# Patient Record
Sex: Female | Born: 1940 | Race: White | Hispanic: No | Marital: Married | Smoking: Former smoker
Health system: Southern US, Community
[De-identification: ages and names within clinical notes are randomized; demographics above are authoritative.]

## PROBLEM LIST (undated history)

## (undated) DIAGNOSIS — E7800 Pure hypercholesterolemia, unspecified: Secondary | ICD-10-CM

## (undated) DIAGNOSIS — M1990 Unspecified osteoarthritis, unspecified site: Secondary | ICD-10-CM

## (undated) DIAGNOSIS — F419 Anxiety disorder, unspecified: Secondary | ICD-10-CM

## (undated) DIAGNOSIS — I10 Essential (primary) hypertension: Secondary | ICD-10-CM

## (undated) HISTORY — PX: ABDOMINAL HYSTERECTOMY: SHX81

## (undated) HISTORY — PX: TOTAL KNEE ARTHROPLASTY: SHX125

---

## 1988-06-15 HISTORY — PX: BREAST CYST ASPIRATION: SHX578

## 2004-11-27 ENCOUNTER — Ambulatory Visit: Payer: Self-pay | Admitting: Internal Medicine

## 2006-12-16 ENCOUNTER — Ambulatory Visit: Payer: Self-pay | Admitting: Internal Medicine

## 2007-06-07 ENCOUNTER — Ambulatory Visit: Payer: Self-pay | Admitting: Internal Medicine

## 2007-12-14 ENCOUNTER — Ambulatory Visit: Payer: Self-pay | Admitting: Internal Medicine

## 2007-12-22 ENCOUNTER — Ambulatory Visit: Payer: Self-pay | Admitting: Internal Medicine

## 2010-01-09 ENCOUNTER — Ambulatory Visit: Payer: Self-pay | Admitting: Internal Medicine

## 2010-01-24 ENCOUNTER — Ambulatory Visit: Payer: Self-pay | Admitting: General Surgery

## 2010-01-27 LAB — PATHOLOGY REPORT

## 2011-01-20 ENCOUNTER — Ambulatory Visit: Payer: Self-pay | Admitting: Internal Medicine

## 2012-02-09 ENCOUNTER — Ambulatory Visit: Payer: Self-pay | Admitting: Internal Medicine

## 2013-02-09 ENCOUNTER — Ambulatory Visit: Payer: Self-pay | Admitting: Internal Medicine

## 2014-03-05 ENCOUNTER — Ambulatory Visit: Payer: Self-pay | Admitting: Internal Medicine

## 2015-01-28 ENCOUNTER — Other Ambulatory Visit: Payer: Self-pay | Admitting: Internal Medicine

## 2015-01-28 DIAGNOSIS — Z1231 Encounter for screening mammogram for malignant neoplasm of breast: Secondary | ICD-10-CM

## 2015-03-11 ENCOUNTER — Ambulatory Visit
Admission: RE | Admit: 2015-03-11 | Discharge: 2015-03-11 | Disposition: A | Discharge status: Home / Self Care | Payer: Medicare Other | Source: Ambulatory Visit | Attending: Internal Medicine | Admitting: Internal Medicine

## 2015-03-11 ENCOUNTER — Other Ambulatory Visit: Payer: Self-pay | Admitting: Internal Medicine

## 2015-03-11 DIAGNOSIS — Z1231 Encounter for screening mammogram for malignant neoplasm of breast: Secondary | ICD-10-CM | POA: Insufficient documentation

## 2015-06-26 ENCOUNTER — Ambulatory Visit
Admission: RE | Admit: 2015-06-26 | Discharge: 2015-06-26 | Disposition: A | Discharge status: Home / Self Care | Payer: Medicare Other | Source: Ambulatory Visit | Attending: Orthopedic Surgery | Admitting: Orthopedic Surgery

## 2015-06-26 ENCOUNTER — Encounter
Admission: RE | Admit: 2015-06-26 | Discharge: 2015-06-26 | Disposition: A | Discharge status: Home / Self Care | Payer: Medicare Other | Source: Ambulatory Visit | Attending: Orthopedic Surgery | Admitting: Orthopedic Surgery

## 2015-06-26 DIAGNOSIS — M170 Bilateral primary osteoarthritis of knee: Secondary | ICD-10-CM | POA: Insufficient documentation

## 2015-06-26 DIAGNOSIS — Z01818 Encounter for other preprocedural examination: Secondary | ICD-10-CM | POA: Insufficient documentation

## 2015-06-26 DIAGNOSIS — I10 Essential (primary) hypertension: Secondary | ICD-10-CM

## 2015-06-26 HISTORY — DX: Essential (primary) hypertension: I10

## 2015-06-26 HISTORY — DX: Pure hypercholesterolemia, unspecified: E78.00

## 2015-06-26 HISTORY — DX: Unspecified osteoarthritis, unspecified site: M19.90

## 2015-06-26 LAB — BASIC METABOLIC PANEL
Anion gap: 7 (ref 5–15)
BUN: 20 mg/dL (ref 6–20)
CHLORIDE: 104 mmol/L (ref 101–111)
CO2: 25 mmol/L (ref 22–32)
Calcium: 9.8 mg/dL (ref 8.9–10.3)
Creatinine, Ser: 0.75 mg/dL (ref 0.44–1.00)
GFR calc non Af Amer: >60 mL/min (ref >60)
Glucose, Bld: 100 mg/dL — ABNORMAL HIGH (ref 65–99)
POTASSIUM: 3.7 mmol/L (ref 3.5–5.1)
SODIUM: 136 mmol/L (ref 135–145)

## 2015-06-26 LAB — ABO/RH: ABO/RH(D): O POS

## 2015-06-26 LAB — URINALYSIS COMPLETE WITH MICROSCOPIC (ARMC ONLY)
BILIRUBIN URINE: NEGATIVE
Bacteria, UA: NONE SEEN
GLUCOSE, UA: NEGATIVE mg/dL
HGB URINE DIPSTICK: NEGATIVE
KETONES UR: NEGATIVE mg/dL
LEUKOCYTES UA: NEGATIVE
NITRITE: NEGATIVE
Protein, ur: NEGATIVE mg/dL
SPECIFIC GRAVITY, URINE: 1.006 (ref 1.005–1.030)
Squamous Epithelial / LPF: NONE SEEN
pH: 7 (ref 5.0–8.0)

## 2015-06-26 LAB — PROTIME-INR
INR: 1.05
PROTHROMBIN TIME: 13.9 s (ref 11.4–15.0)

## 2015-06-26 LAB — CBC
HEMATOCRIT: 43.7 % (ref 35.0–47.0)
HEMOGLOBIN: 14.7 g/dL (ref 12.0–16.0)
MCH: 31.5 pg (ref 26.0–34.0)
MCHC: 33.7 g/dL (ref 32.0–36.0)
MCV: 93.5 fL (ref 80.0–100.0)
Platelets: 334 10*3/uL (ref 150–440)
RBC: 4.67 MIL/uL (ref 3.80–5.20)
RDW: 13.2 % (ref 11.5–14.5)
WBC: 10 10*3/uL (ref 3.6–11.0)

## 2015-06-26 LAB — SURGICAL PCR SCREEN
MRSA, PCR: NEGATIVE
STAPHYLOCOCCUS AUREUS: NEGATIVE

## 2015-06-26 LAB — APTT: aPTT: 27 seconds (ref 24–36)

## 2015-06-26 NOTE — Patient Instructions (Signed)
Your procedure is scheduled on: Wednesday Jan. 25, 2017. Report to Same Day Surgery. To find out your arrival time please call [phone] between 1PM - 3PM on Tuesday Jan, 24, 2017.  Remember: Instructions that are not followed completely may result in serious medical risk, up to and including death, or upon the discretion of your surgeon and anesthesiologist your surgery may need to be rescheduled.    _x___ 1. Do not eat food or drink liquids after midnight. No gum chewing or hard candies.     ____ 2. No Alcohol for 24 hours before or after surgery.   ____ 3. Bring all medications with you on the day of surgery if instructed.    __x__ 4. Notify your doctor if there is any change in your medical condition     (cold, fever, infections).     Do not wear jewelry, make-up, hairpins, clips or nail polish.  Do not wear lotions, powders, or perfumes. You may wear deodorant.  Do not shave 48 hours prior to surgery. Men may shave face and neck.  Do not bring valuables to the hospital.    [facility] is not responsible for any belongings or valuables.               Contacts, dentures or bridgework may not be worn into surgery.  Leave your suitcase in the car. After surgery it may be brought to your room.  For patients admitted to the hospital, discharge time is determined by your treatment team.   Patients discharged the day of surgery will not be allowed to drive home.    Please read over the following fact sheets that you were given:   [facility]  ____ Take these medicines the morning of surgery with A SIP OF WATER: None    ____ Fleet Enema (as directed)   __x__ Use CHG Soap as directed  ____ Use inhalers on the day of surgery  ____ Stop metformin 2 days prior to surgery    ____ Take 1/2 of usual insulin dose the night before surgery and none on the morning of surgery.   __x__ Already stopped aspirin.  __x__ Stop Anti-inflammatories does not apply.  OK  to take Tylenol.   ____ Stop supplements until after surgery.    ____ Bring C-Pap to the hospital.

## 2015-07-10 ENCOUNTER — Inpatient Hospital Stay: Payer: Medicare Other

## 2015-07-10 ENCOUNTER — Inpatient Hospital Stay: Payer: Medicare Other | Admitting: Anesthesiology

## 2015-07-10 ENCOUNTER — Encounter
Admission: RE | Disposition: A | Discharge status: Home Health | Payer: Self-pay | Source: Ambulatory Visit | Attending: Orthopedic Surgery

## 2015-07-10 ENCOUNTER — Encounter: Payer: Self-pay | Admitting: *Deleted

## 2015-07-10 ENCOUNTER — Inpatient Hospital Stay
Admission: RE | Admit: 2015-07-10 | Discharge: 2015-07-12 | DRG: 470 | Disposition: A | Discharge status: Home Health | Payer: Medicare Other | Source: Ambulatory Visit | Attending: Orthopedic Surgery | Admitting: Orthopedic Surgery

## 2015-07-10 DIAGNOSIS — M1711 Unilateral primary osteoarthritis, right knee: Secondary | ICD-10-CM | POA: Diagnosis present

## 2015-07-10 DIAGNOSIS — Z7982 Long term (current) use of aspirin: Secondary | ICD-10-CM

## 2015-07-10 DIAGNOSIS — Z79899 Other long term (current) drug therapy: Secondary | ICD-10-CM | POA: Diagnosis not present

## 2015-07-10 DIAGNOSIS — Z96651 Presence of right artificial knee joint: Secondary | ICD-10-CM

## 2015-07-10 HISTORY — PX: TOTAL KNEE ARTHROPLASTY: SHX125

## 2015-07-10 LAB — PREPARE RBC (CROSSMATCH)

## 2015-07-10 SURGERY — ARTHROPLASTY, KNEE, TOTAL
Anesthesia: Spinal | Laterality: Right

## 2015-07-10 MED ORDER — MIDAZOLAM HCL 5 MG/5ML IJ SOLN
INTRAMUSCULAR | Status: DC | PRN
  Administered 2015-07-10: 2 mg via INTRAVENOUS

## 2015-07-10 MED ORDER — DOCUSATE SODIUM 100 MG PO CAPS
100.0000 mg | ORAL_CAPSULE | Freq: Two times a day (BID) | ORAL | Status: DC
  Administered 2015-07-10 – 2015-07-12 (×4): 100 mg via ORAL
  Filled 2015-07-10 (×4): qty 1

## 2015-07-10 MED ORDER — SODIUM CHLORIDE 0.9 % IV SOLN
INTRAVENOUS | Status: DC
  Administered 2015-07-10: 18:00:00 via INTRAVENOUS

## 2015-07-10 MED ORDER — ADULT MULTIVITAMIN W/MINERALS CH
1.0000 | ORAL_TABLET | ORAL | Status: DC
Start: 2015-07-11 — End: 2015-07-12
  Administered 2015-07-11 – 2015-07-12 (×2): 1 via ORAL
  Filled 2015-07-10 (×3): qty 1

## 2015-07-10 MED ORDER — MENTHOL 3 MG MT LOZG: 1.0000 | LOZENGE | OROMUCOSAL | Status: DC | PRN

## 2015-07-10 MED ORDER — MAGNESIUM CITRATE PO SOLN: 1.0000 | Freq: Once | ORAL | Status: DC | PRN

## 2015-07-10 MED ORDER — FENTANYL CITRATE (PF) 100 MCG/2ML IJ SOLN
INTRAMUSCULAR | Status: AC
Start: 2015-07-10 — End: 2015-07-11
  Filled 2015-07-10: qty 2

## 2015-07-10 MED ORDER — CEFAZOLIN SODIUM-DEXTROSE 2-3 GM-% IV SOLR
2.0000 g | Freq: Four times a day (QID) | INTRAVENOUS | Status: AC
  Administered 2015-07-10 – 2015-07-11 (×2): 2 g via INTRAVENOUS
  Filled 2015-07-10 (×2): qty 50

## 2015-07-10 MED ORDER — ONDANSETRON HCL 4 MG/2ML IJ SOLN
4.0000 mg | Freq: Once | INTRAMUSCULAR | Status: DC | PRN
Start: 2015-07-10 — End: 2015-07-10

## 2015-07-10 MED ORDER — ONDANSETRON HCL 4 MG PO TABS: 4.0000 mg | ORAL_TABLET | Freq: Four times a day (QID) | ORAL | Status: DC | PRN

## 2015-07-10 MED ORDER — SODIUM CHLORIDE 0.9 % IJ SOLN
INTRAMUSCULAR | Status: AC
  Filled 2015-07-10: qty 3

## 2015-07-10 MED ORDER — HYDROMORPHONE HCL 1 MG/ML IJ SOLN: 1.0000 mg | INTRAMUSCULAR | Status: DC | PRN

## 2015-07-10 MED ORDER — ONDANSETRON HCL 4 MG/2ML IJ SOLN: 4.0000 mg | Freq: Four times a day (QID) | INTRAMUSCULAR | Status: DC | PRN

## 2015-07-10 MED ORDER — ENOXAPARIN SODIUM 30 MG/0.3ML ~~LOC~~ SOLN
30.0000 mg | Freq: Two times a day (BID) | SUBCUTANEOUS | Status: DC
  Administered 2015-07-11 – 2015-07-12 (×3): 30 mg via SUBCUTANEOUS
  Filled 2015-07-10 (×3): qty 0.3

## 2015-07-10 MED ORDER — ALUM & MAG HYDROXIDE-SIMETH 200-200-20 MG/5ML PO SUSP: 30.0000 mL | ORAL | Status: DC | PRN

## 2015-07-10 MED ORDER — HYDROCHLOROTHIAZIDE 12.5 MG PO CAPS
12.5000 mg | ORAL_CAPSULE | Freq: Every day | ORAL | Status: DC
  Administered 2015-07-11 – 2015-07-12 (×2): 12.5 mg via ORAL
  Filled 2015-07-10 (×2): qty 1

## 2015-07-10 MED ORDER — POTASSIUM CHLORIDE ER 8 MEQ PO CPCR
8.0000 meq | ORAL_CAPSULE | ORAL | Status: DC
  Administered 2015-07-11 – 2015-07-12 (×2): 8 meq via ORAL
  Filled 2015-07-10 (×6): qty 1

## 2015-07-10 MED ORDER — BUPIVACAINE LIPOSOME 1.3 % IJ SUSP
INTRAMUSCULAR | Status: DC | PRN
  Administered 2015-07-10: 120 mL

## 2015-07-10 MED ORDER — FAMOTIDINE 20 MG PO TABS
20.0000 mg | ORAL_TABLET | Freq: Once | ORAL | Status: AC
Start: 2015-07-10 — End: 2015-07-10
  Administered 2015-07-10: 20 mg via ORAL

## 2015-07-10 MED ORDER — CELECOXIB 200 MG PO CAPS
200.0000 mg | ORAL_CAPSULE | Freq: Two times a day (BID) | ORAL | Status: DC
  Administered 2015-07-10 – 2015-07-12 (×4): 200 mg via ORAL
  Filled 2015-07-10 (×4): qty 1

## 2015-07-10 MED ORDER — PHENOL 1.4 % MT LIQD: 1.0000 | OROMUCOSAL | Status: DC | PRN

## 2015-07-10 MED ORDER — ACETAMINOPHEN 325 MG PO TABS: 650.0000 mg | ORAL_TABLET | Freq: Four times a day (QID) | ORAL | Status: DC | PRN

## 2015-07-10 MED ORDER — OXYCODONE HCL 5 MG PO TABS
5.0000 mg | ORAL_TABLET | ORAL | Status: DC | PRN
  Administered 2015-07-10 – 2015-07-11 (×5): 5 mg via ORAL
  Administered 2015-07-11 (×2): 10 mg via ORAL
  Administered 2015-07-11: 5 mg via ORAL
  Administered 2015-07-12 (×3): 10 mg via ORAL
  Filled 2015-07-10 (×2): qty 2
  Filled 2015-07-10 (×7): qty 1
  Filled 2015-07-10 (×3): qty 2
  Filled 2015-07-10: qty 1

## 2015-07-10 MED ORDER — MAGNESIUM OXIDE 400 (241.3 MG) MG PO TABS
400.0000 mg | ORAL_TABLET | Freq: Every day | ORAL | Status: DC
  Administered 2015-07-10 – 2015-07-11 (×2): 400 mg via ORAL
  Filled 2015-07-10 (×2): qty 1

## 2015-07-10 MED ORDER — LABETALOL HCL 5 MG/ML IV SOLN
INTRAVENOUS | Status: DC | PRN
  Administered 2015-07-10 (×2): 5 mg via INTRAVENOUS
  Administered 2015-07-10: 10 mg via INTRAVENOUS

## 2015-07-10 MED ORDER — PROPOFOL 500 MG/50ML IV EMUL
INTRAVENOUS | Status: DC | PRN
  Administered 2015-07-10: 75 ug/kg/min via INTRAVENOUS

## 2015-07-10 MED ORDER — NEOMYCIN-POLYMYXIN B GU 40-200000 IR SOLN
Status: DC | PRN
  Administered 2015-07-10: 4 mL

## 2015-07-10 MED ORDER — METHOCARBAMOL 500 MG PO TABS
500.0000 mg | ORAL_TABLET | Freq: Four times a day (QID) | ORAL | Status: DC | PRN
  Administered 2015-07-11 (×3): 500 mg via ORAL
  Filled 2015-07-10 (×3): qty 1

## 2015-07-10 MED ORDER — LISINOPRIL 10 MG PO TABS
10.0000 mg | ORAL_TABLET | Freq: Every day | ORAL | Status: DC
  Administered 2015-07-11 – 2015-07-12 (×2): 10 mg via ORAL
  Filled 2015-07-10 (×2): qty 1

## 2015-07-10 MED ORDER — FENTANYL CITRATE (PF) 100 MCG/2ML IJ SOLN
INTRAMUSCULAR | Status: DC | PRN
  Administered 2015-07-10 (×2): 50 ug via INTRAVENOUS

## 2015-07-10 MED ORDER — BISACODYL 5 MG PO TBEC: 5.0000 mg | DELAYED_RELEASE_TABLET | Freq: Every day | ORAL | Status: DC | PRN

## 2015-07-10 MED ORDER — ONDANSETRON HCL 4 MG/2ML IJ SOLN
INTRAMUSCULAR | Status: DC | PRN
  Administered 2015-07-10: 4 mg via INTRAVENOUS

## 2015-07-10 MED ORDER — CEFAZOLIN SODIUM-DEXTROSE 2-3 GM-% IV SOLR
2.0000 g | Freq: Once | INTRAVENOUS | Status: AC
  Administered 2015-07-10: 2 g via INTRAVENOUS

## 2015-07-10 MED ORDER — MAGNESIUM HYDROXIDE 400 MG/5ML PO SUSP
30.0000 mL | Freq: Every day | ORAL | Status: DC | PRN
  Administered 2015-07-11 – 2015-07-12 (×2): 30 mL via ORAL
  Filled 2015-07-10 (×2): qty 30

## 2015-07-10 MED ORDER — GLYCOPYRROLATE 0.2 MG/ML IJ SOLN
INTRAMUSCULAR | Status: DC | PRN
  Administered 2015-07-10: 0.2 mg via INTRAVENOUS

## 2015-07-10 MED ORDER — ACETAMINOPHEN 650 MG RE SUPP
650.0000 mg | Freq: Four times a day (QID) | RECTAL | Status: DC | PRN
Start: 2015-07-10 — End: 2015-07-12

## 2015-07-10 MED ORDER — ATORVASTATIN CALCIUM 10 MG PO TABS
10.0000 mg | ORAL_TABLET | ORAL | Status: DC
  Administered 2015-07-11 – 2015-07-12 (×2): 10 mg via ORAL
  Filled 2015-07-10 (×3): qty 1

## 2015-07-10 MED ORDER — METOCLOPRAMIDE HCL 5 MG PO TABS
5.0000 mg | ORAL_TABLET | Freq: Three times a day (TID) | ORAL | Status: DC | PRN
Start: 2015-07-10 — End: 2015-07-12

## 2015-07-10 MED ORDER — METOCLOPRAMIDE HCL 5 MG/ML IJ SOLN: 5.0000 mg | Freq: Three times a day (TID) | INTRAMUSCULAR | Status: DC | PRN

## 2015-07-10 MED ORDER — PROPOFOL 10 MG/ML IV BOLUS
INTRAVENOUS | Status: DC | PRN
  Administered 2015-07-10: 50 mg via INTRAVENOUS

## 2015-07-10 MED ORDER — LACTATED RINGERS IV SOLN
INTRAVENOUS | Status: DC
  Administered 2015-07-10: 12:00:00 via INTRAVENOUS

## 2015-07-10 MED ORDER — FENTANYL CITRATE (PF) 100 MCG/2ML IJ SOLN
25.0000 ug | INTRAMUSCULAR | Status: DC | PRN
  Administered 2015-07-10 (×4): 25 ug via INTRAVENOUS

## 2015-07-10 MED ORDER — ASPIRIN EC 81 MG PO TBEC
81.0000 mg | DELAYED_RELEASE_TABLET | ORAL | Status: DC
  Administered 2015-07-11 – 2015-07-12 (×2): 81 mg via ORAL
  Filled 2015-07-10 (×3): qty 1

## 2015-07-10 MED ORDER — LISINOPRIL-HYDROCHLOROTHIAZIDE 10-12.5 MG PO TABS: 1.0000 | ORAL_TABLET | Freq: Every day | ORAL | Status: DC

## 2015-07-10 MED ORDER — METHOCARBAMOL 1000 MG/10ML IJ SOLN: 500.0000 mg | Freq: Four times a day (QID) | INTRAVENOUS | Status: DC | PRN

## 2015-07-10 SURGICAL SUPPLY — 62 items
AUTOTRANSFUS HAS 1/8 (MISCELLANEOUS) ×3
BAG DECANTER FOR FLEXI CONT (MISCELLANEOUS) ×3 IMPLANT
BLADE DEBAKEY 8.0 (BLADE) ×2 IMPLANT
BLADE DEBAKEY 8.0MM (BLADE) ×1
BLADE SAW 1 (BLADE) ×3 IMPLANT
BLADE SAW 1/2 (BLADE) ×3 IMPLANT
BLADE SURG 15 STRL LF DISP TIS (BLADE) ×1 IMPLANT
BLADE SURG 15 STRL SS (BLADE) ×2
CANISTER SUCT 1200ML W/VALVE (MISCELLANEOUS) ×6 IMPLANT
CAP KNEE TOTAL 3 SIGMA ×3 IMPLANT
CATH TRAY METER 16FR LF (MISCELLANEOUS) ×3 IMPLANT
CEMENT HV SMART SET (Cement) ×6 IMPLANT
CLOSURE WOUND 1/2 X4 (GAUZE/BANDAGES/DRESSINGS) ×2
COOLER POLAR GLACIER W/PUMP (MISCELLANEOUS) ×3 IMPLANT
DRAPE IMP U-DRAPE 54X76 (DRAPES) ×3 IMPLANT
DRAPE INCISE IOBAN 66X60 STRL (DRAPES) ×3 IMPLANT
DRAPE SHEET LG 3/4 BI-LAMINATE (DRAPES) ×3 IMPLANT
DRAPE SURG 17X11 SM STRL (DRAPES) ×6 IMPLANT
DRSG OPSITE POSTOP 4X12 (GAUZE/BANDAGES/DRESSINGS) ×3 IMPLANT
DRSG OPSITE POSTOP 4X14 (GAUZE/BANDAGES/DRESSINGS) ×3 IMPLANT
DURAPREP 26ML APPLICATOR (WOUND CARE) ×9 IMPLANT
ELECT REM PT RETURN 9FT ADLT (ELECTROSURGICAL) ×3
ELECTRODE REM PT RTRN 9FT ADLT (ELECTROSURGICAL) ×1 IMPLANT
GAUZE PETRO XEROFOAM 1X8 (MISCELLANEOUS) ×6 IMPLANT
GAUZE SPONGE 4X4 12PLY STRL (GAUZE/BANDAGES/DRESSINGS) ×3 IMPLANT
GLOVE BIOGEL PI IND STRL 9 (GLOVE) ×1 IMPLANT
GLOVE BIOGEL PI INDICATOR 9 (GLOVE) ×2
GLOVE SURG 9.0 ORTHO LTXF (GLOVE) ×9 IMPLANT
GOWN STRL REUS TWL 2XL XL LVL4 (GOWN DISPOSABLE) ×3 IMPLANT
GOWN STRL REUS W/ TWL LRG LVL3 (GOWN DISPOSABLE) ×4 IMPLANT
GOWN STRL REUS W/TWL LRG LVL3 (GOWN DISPOSABLE) ×8
GOWN STRL REUS W/TWL XL LVL4 (GOWN DISPOSABLE) ×3 IMPLANT
HANDPIECE SUCTION TUBG SURGILV (MISCELLANEOUS) ×3 IMPLANT
IMMBOLIZER KNEE 19 BLUE UNIV (SOFTGOODS) ×3 IMPLANT
IV NS 100ML SINGLE PACK (IV SOLUTION) ×3 IMPLANT
KIT RM TURNOVER STRD PROC AR (KITS) ×3 IMPLANT
NDL SAFETY 18GX1.5 (NEEDLE) ×3 IMPLANT
NDL SAFETY 22GX1.5 (NEEDLE) ×3 IMPLANT
NEEDLE SPNL 20GX3.5 QUINCKE YW (NEEDLE) ×3 IMPLANT
NS IRRIG 1000ML POUR BTL (IV SOLUTION) ×3 IMPLANT
PACK TOTAL KNEE (MISCELLANEOUS) ×3 IMPLANT
PAD PREP 24X41 OB/GYN DISP (PERSONAL CARE ITEMS) ×3 IMPLANT
PAD WRAPON POLAR KNEE (MISCELLANEOUS) ×1 IMPLANT
SOL .9 NS 3000ML IRR  AL (IV SOLUTION) ×2
SOL .9 NS 3000ML IRR UROMATIC (IV SOLUTION) ×1 IMPLANT
SPONGE DRAIN TRACH 4X4 STRL 2S (GAUZE/BANDAGES/DRESSINGS) ×3 IMPLANT
SPONGE LAP 18X18 5 PK (GAUZE/BANDAGES/DRESSINGS) ×3 IMPLANT
STAPLER SKIN PROX 35W (STAPLE) ×3 IMPLANT
STRIP CLOSURE SKIN 1/2X4 (GAUZE/BANDAGES/DRESSINGS) ×4 IMPLANT
SUCTION FRAZIER HANDLE 10FR (MISCELLANEOUS) ×2
SUCTION TUBE FRAZIER 10FR DISP (MISCELLANEOUS) ×1 IMPLANT
SUT ETHIBOND NAB CT1 #1 30IN (SUTURE) ×9 IMPLANT
SUT MNCRL AB 3-0 PS2 27 (SUTURE) ×6 IMPLANT
SUT VIC AB 0 CT1 36 (SUTURE) ×3 IMPLANT
SUT VIC AB 2-0 CT1 (SUTURE) ×9 IMPLANT
SYR 20CC LL (SYRINGE) ×3 IMPLANT
SYR 30ML LL (SYRINGE) ×3 IMPLANT
SYR 50ML LL SCALE MARK (SYRINGE) ×3 IMPLANT
SYSTEM AUTOTRANSFUS DUAL TROCR (MISCELLANEOUS) ×1 IMPLANT
TOWER CARTRIDGE SMART MIX (DISPOSABLE) ×3 IMPLANT
TUBE SUCT KAM VAC (TUBING) ×3 IMPLANT
WRAPON POLAR PAD KNEE (MISCELLANEOUS) ×3

## 2015-07-10 NOTE — H&P (Signed)
The patient has been re-examined, and the chart reviewed, and there have been no interval changes to the documented history and physical.    The risks, benefits, and alternatives have been discussed at length, and the patient is willing to proceed.   

## 2015-07-10 NOTE — Op Note (Signed)
DATE OF SURGERY:  07/10/2015 TIME: 4:48 PM  PATIENT NAME:  Maria Faulkner   AGE: 75 y.o.    PRE-OPERATIVE DIAGNOSIS:  RIGHT KNEE UNILATERAL PRIMARY OSTEOARTHRITIS  POST-OPERATIVE DIAGNOSIS:  Same  PROCEDURE:  Procedure(s): RIGHT TOTAL KNEE ARTHROPLASTY  SURGEON:  Juanell Fairly, MD   ASSISTANT:  Myra Rude, MD, Jamison Neighbor, [place]  OPERATIVE IMPLANTS: Depuy PFC Sigma, Posterior Stabilized Femural component size 2, Tibia size rotating platform component size 2.5, Patella polyethylene 3-peg oval button size 35, with a 12.5 mm polyethylene insert.   PREOPERATIVE INDICATIONS:  Maria Faulkner is an 75 y.o. female who has a diagnosis of right knee osteoarthritis and elected for a right total knee arthroplasty after failing nonoperative treatment who has significant impairment of their activities of daily living.  Radiographs have demonstrated tricompartmental osteoarthritis joint space narrowing, osteophytes, subchondral sclerosis and cyst formation.  The risks, benefits, and alternatives were discussed at length including but not limited to the risks of infection, bleeding, nerve or blood vessel injury, knee stiffness, fracture, dislocation, loosening or failure of the hardware and the need for further surgery. Medical risks include but not limited to DVT and pulmonary embolism, myocardial infarction, stroke, pneumonia, respiratory failure and death. I discussed these risks with the patient in my office prior to the date of surgery. They understood these risks and were willing to proceed.  OPERATIVE FINDINGS AND UNIQUE ASPECTS OF THE CASE:  Tricompartmental osteoarthritis  OPERATIVE DESCRIPTION:  The patient was brought to the operative room and placed in a supine position after undergoing placement of a spinal anesthetic.  A Foley catheter was placed.  IV antibiotics were given. Patient received Ancef 2 grams IV. The lower extremity was prepped and draped in the usual  sterile fashion.  A time out was performed to verify the patient's name, date of birth, medical record number, correct site of surgery and correct procedure to be performed. The timeout was also used to confirm the patient received antibiotics and that appropriate instruments, implants and radiographs studies were available in the room.  The leg was elevated and exsanguinated with an Esmarch and the tourniquet was inflated to 275 mmHg for 123 minutes..  A midline incision was made over the right knee. Full-thickness skin flaps were developed. A medial parapatellar arthrotomy was then made and the patella everted and the knee was brought into 90 of flexion. Hoffa's fat pad along with the cruciate ligaments and medial and lateral menisci were resected.   The distal femoral intramedullary canal was opened with a drill and the intramedullary distal femoral cutting jig was inserted into the femoral canal pinned into position. It was set at 5 degrees resecting 12 mm off the distal femur given her baseline flexion contracture.  Care was taken to protect the collateral ligaments during distal femoral resection.  The distal femoral resection was performed with an oscillating saw. The femoral cutting guide was then removed.  The extramedullary tibial cutting guide was then placed using the anterior tibial crest and second ray of the foot as a references.  The tibial cutting guide was adjusted to allow for appropriate posterior slope.  The tibial cutting block was pinned into position. The slotted stylus was used to measure the proximal tibial resection of 10 mm off the high lateral side.  The tibial long rod alignment guide was then used to confirm position of the cutting block. A third cross pin through the tibial cutting block was then drilled into position to allow for rotational  stability. Care was taken during the tibial resection to protect the medial and collateral ligaments.  The resected tibial bone was  removed along with the posterior horns of the menisci.  The PCL was sacrificed.  Extension gap was measured with a spacer block and alignment and extension was confirmed using a long alignment rod.  The attention was then turned back to the femur. The posterior referencing distal femoral sizing guide was applied to the distal femur.  The femur was sized to be a 2. Rotation of the referencing guide was checked with the epicondylar axis and Whitesides line. Then the 4-in-1 cutting jig was then applied to the distal femur. A stylus was used to confirm that the anterior femur would not be notched.   Then the anterior, posterior and chamfer femoral cuts were then made with an oscillating saw.  All posterior osteophytes were removed.  The flexion gap was then measured with a flexion spacer block and long alignment rod and was found to be symmetric with the extension gap and perpendicular to mechanical axis of the tibia.  The distal femoral preparation was completed by performing the posterior stabilized box cut using the cutting block. The entry site for the intramedullary femoral guide was filled with autologous bone graft from bone previously resected earlier in the case.  The proximal tibia plateau was then sized with trial trays. The best coverage was achieved with a size 2.5. This tibial tray was then pinned into position. The proximal tibia was then prepared with the reamer and keel punch.  After tibial preparation was completed, all trial components were inserted with polyethylene trials.  The knee was found to have excellent balance and full motion with a size 12.5 mm tibial polyethylene insert..    The attention was then turned to preparation of the patella. The thickness of the patella was measured with a caliper, the diameter measured with the patella templates to a size 35.  The patella resection was then made with an oscillating saw using the patella cutting guide.  The final patellar thickness 15 mm.   3 peg holes for the patella component were then drilled. The trial patella was then placed. Knee was taken through a full range of motion and deemed to be stable with the trial components. All trial components were then removed. The knee capsule was then injected with Exparel. The joint was copiously irrigated with pulse lavage.  The final total knee arthroplasty components were then cemented into place with a 12.5 mm trial polyethylene insert and all excess methylmethacrylate was removed.  The joint was again copiously irrigated. After the cement had hardened the knee was again taken through a full range of motion. It was felt to be most stable with the 12.5 mm tibial polyethylene insert. The actual tibial polyethylene insert was then placed.   The knee was taken through a range of motion and the patella tracked well and the knee was again irrigated copiously.    An Autovac drain was placed. The 2 drain limbs were brought out through the superior lateral knee. The medial arthrotomy was closed with #1 Ethibond. The subcutaneous tissue closed with 0 and 2-0 vicryl, and skin approximated with staples.  A dry sterile and compressive dressing was applied.  A Polar Care was applied to the operative knee along with TENS unit pads and a knee immobilizer.  The patient was awakened and brought to the PACU in stable and satisfactory condition.  All sharp, lap and instrument counts were  correct at the conclusion the case. I spoke with the patient's husband in the postop consultation room to let them know the case it done without complication and the patient was stable in recovery room.   Total tourniquet time was 123 minutes.    Kathreen Devoid, MD

## 2015-07-10 NOTE — Progress Notes (Signed)
TENS REP in to speak with patient and spouse

## 2015-07-10 NOTE — Transfer of Care (Signed)
Immediate Anesthesia Transfer of Care Note  Patient: Maria Faulkner  Procedure(s) Performed: Procedure(s): TOTAL KNEE ARTHROPLASTY (Right)  Patient Location: PACU  Anesthesia Type:Spinal  Level of Consciousness: awake and sedated  Airway & Oxygen Therapy: Patient Spontanous Breathing and Patient connected to face mask oxygen  Post-op Assessment: Report given to RN and Post -op Vital signs reviewed and stable  Post vital signs: Reviewed and stable  Last Vitals:  Filed Vitals:   07/10/15 1159  BP: 145/50  Pulse: 86  Temp: 36.6 C  Resp: 18    Complications: No apparent anesthesia complications

## 2015-07-10 NOTE — Anesthesia Preprocedure Evaluation (Addendum)
Anesthesia Evaluation  Patient identified by MRN, date of birth, ID band Patient awake    Reviewed: Allergy & Precautions, NPO status , Patient's Chart, lab work & pertinent test results  Airway Mallampati: III  TM Distance: >3 FB Neck ROM: Limited    Dental  (+) Partial Lower, Partial Upper   Pulmonary former smoker,    Pulmonary exam normal breath sounds clear to auscultation       Cardiovascular hypertension, Pt. on medications Normal cardiovascular exam     Neuro/Psych negative neurological ROS  negative psych ROS   GI/Hepatic negative GI ROS, Neg liver ROS,   Endo/Other  negative endocrine ROS  Renal/GU negative Renal ROS  negative genitourinary   Musculoskeletal  (+) Arthritis , Osteoarthritis,    Abdominal Normal abdominal exam  (+)   Peds negative pediatric ROS (+)  Hematology negative hematology ROS (+)   Anesthesia Other Findings Increased cholesterol  Reproductive/Obstetrics                           Anesthesia Physical Anesthesia Plan  ASA: II  Anesthesia Plan: Spinal   Post-op Pain Management:    Induction: Intravenous  Airway Management Planned: Nasal Cannula  Additional Equipment:   Intra-op Plan:   Post-operative Plan:   Informed Consent: I have reviewed the patients History and Physical, chart, labs and discussed the procedure including the risks, benefits and alternatives for the proposed anesthesia with the patient or authorized representative who has indicated his/her understanding and acceptance.   Dental advisory given  Plan Discussed with: CRNA and Surgeon  Anesthesia Plan Comments:         Anesthesia Quick Evaluation

## 2015-07-10 NOTE — Progress Notes (Signed)
Cefazolin 2 gm and sacral drsg sent with pt to OR (for block) To OR with thermal cap in place

## 2015-07-10 NOTE — Anesthesia Procedure Notes (Signed)
Spinal Patient location during procedure: OR Start time: 07/10/2015 1:36 PM End time: 07/10/2015 1:41 PM Staffing Anesthesiologist: Yves Dill Performed by: anesthesiologist  Preanesthetic Checklist Completed: patient identified, site marked, surgical consent, pre-op evaluation, timeout performed, IV checked, risks and benefits discussed and monitors and equipment checked Spinal Block Patient position: sitting Prep: Betadine Patient monitoring: heart rate, cardiac monitor, continuous pulse ox and blood pressure Approach: midline Location: L3-4 Injection technique: single-shot Needle Needle type: Whitacre  Needle gauge: 25 G Assessment Sensory level: T8 Additional Notes Time out  Called.  Patient placed in sitting position and prepped and draped in sterile fashion.  A skin wheal was made with 1% Lidocaine plain.  A 25 G Whit needle was advanced with the return of clear CSF in all 4 Quads.  Patient tolerated the procedure well .. !  of marcaine +  of Tetracaine injected.

## 2015-07-11 ENCOUNTER — Encounter: Payer: Self-pay | Admitting: Orthopedic Surgery

## 2015-07-11 LAB — TYPE AND SCREEN
ABO/RH(D): O POS
Antibody Screen: NEGATIVE
UNIT DIVISION: 0
Unit division: 0

## 2015-07-11 LAB — BASIC METABOLIC PANEL
ANION GAP: 4 — AB (ref 5–15)
BUN: 16 mg/dL (ref 6–20)
CHLORIDE: 105 mmol/L (ref 101–111)
CO2: 29 mmol/L (ref 22–32)
Calcium: 9.1 mg/dL (ref 8.9–10.3)
Creatinine, Ser: 0.76 mg/dL (ref 0.44–1.00)
GFR calc non Af Amer: >60 mL/min (ref >60)
Glucose, Bld: 132 mg/dL — ABNORMAL HIGH (ref 65–99)
POTASSIUM: 5.1 mmol/L (ref 3.5–5.1)
SODIUM: 138 mmol/L (ref 135–145)

## 2015-07-11 LAB — CBC
HCT: 35.5 % (ref 35.0–47.0)
HEMOGLOBIN: 12 g/dL (ref 12.0–16.0)
MCH: 32 pg (ref 26.0–34.0)
MCHC: 34 g/dL (ref 32.0–36.0)
MCV: 94.1 fL (ref 80.0–100.0)
Platelets: 227 10*3/uL (ref 150–440)
RBC: 3.77 MIL/uL — AB (ref 3.80–5.20)
RDW: 12.8 % (ref 11.5–14.5)
WBC: 13.3 10*3/uL — AB (ref 3.6–11.0)

## 2015-07-11 NOTE — Progress Notes (Signed)
Physical Therapy Treatment Patient Details Name: Maria Faulkner MRN: [id number] DOB: [birth year] Today's Date: 07/11/2015    History of Present Illness Pt underwent R TKR without reported post-op complications. Pt is POD#1 at time of evaluation. No reported falls in the last 12 months.    PT Comments    Pt demonstrates ability to increase ambulation distance this afternoon. Able to progress to seated exercises and R knee flexion AROM appears to be improving. Sequencing with bed mobility and transfers improving. Pt will benefit from skilled PT services to address deficits in strength, balance, and mobility in order to return to full function at home.   Follow Up Recommendations  Home health PT     Equipment Recommendations  None recommended by PT    Recommendations for Other Services       Precautions / Restrictions Precautions Precautions: Knee;Fall Precaution Booklet Issued: Yes (comment) (per PT) Required Braces or Orthoses: Knee Immobilizer - Right Knee Immobilizer - Right: Discontinue once straight leg raise with < 10 degree lag Restrictions Weight Bearing Restrictions: Yes RLE Weight Bearing: Partial weight bearing RLE Partial Weight Bearing Percentage or Pounds: 25-50%    Mobility  Bed Mobility Overal bed mobility: Needs Assistance Bed Mobility: Sit to Supine       Sit to supine: Min assist   General bed mobility comments: Pt requires assist for RLE when returning to bed.  Transfers Overall transfer level: Needs assistance Equipment used: Rolling walker (2 wheeled) Transfers: Sit to/from Stand Sit to Stand: Min guard         General transfer comment: Pt again provided cues for safe hand placement and proper sequencing. Able to maintain PWB on RLE. Good stability noted during transfer.  Ambulation/Gait Ambulation/Gait assistance: Min guard Ambulation Distance (Feet): 75 Feet Assistive device: Rolling walker (2 wheeled) Gait Pattern/deviations:  Decreased step length - left;Decreased stance time - right;Decreased weight shift to right Gait velocity: Decreased   General Gait Details: Pt demonstrates increased ambulation distance this afternoon. Cues for sequencing with walker during ambulation and turns. Decreased weight accetpance to RLE during gait and decrease push off at terminal stance. Pt reports increase in R knee pain with ambulation. Vitals remain WNL throughout ambulation distance.   Stairs            Wheelchair Mobility    Modified Rankin (Stroke Patients Only)       Balance Overall balance assessment: Needs assistance Sitting-balance support: No upper extremity supported Sitting balance-Leahy Scale: Good       Standing balance-Leahy Scale: Fair                      Cognition Arousal/Alertness: Awake/alert Behavior During Therapy: WFL for tasks assessed/performed Overall Cognitive Status: Within Functional Limits for tasks assessed                      Exercises Total Joint Exercises Ankle Circles/Pumps: Strengthening;Both;10 reps;Seated (heel raises) Hip ABduction/ADduction: Strengthening;10 reps;Seated;Both Long Arc Quad: Strengthening;Right;10 reps;Seated Knee Flexion: Strengthening;Right;10 reps;Seated Marching in Standing: Strengthening;Right;10 reps;Seated    General Comments        Pertinent Vitals/Pain Pain Assessment: 0-10 Pain Score: 2  Pain Location: R knee, increases to 6/10 with mobility Pain Intervention(s): Limited activity within patient's tolerance;Monitored during session;Premedicated before session    Home Living                      Prior Function  PT Goals (current goals can now be found in the care plan section) Acute Rehab PT Goals Patient Stated Goal: to go home PT Goal Formulation: With patient Time For Goal Achievement: 07/25/15 Potential to Achieve Goals: Good Progress towards PT goals: Progressing toward goals     Frequency  BID    PT Plan Current plan remains appropriate    Co-evaluation             End of Session Equipment Utilized During Treatment: Gait belt Activity Tolerance: Patient tolerated treatment well Patient left: with SCD's reapplied;in bed;with bed alarm set;with call bell/phone within reach (polar care in place, towel roll under heel, KI doffed)     Time: 1610-9604 PT Time Calculation (min) (ACUTE ONLY): 24 min  Charges:  $Gait Training: 8-22 mins $Therapeutic Exercise: 8-22 mins                    G Codes:      Sharalyn Ink Huprich PT, DPT   Huprich,Jason 07/11/2015, 4:26 PM

## 2015-07-11 NOTE — Progress Notes (Signed)
Subjective:  POD #1  S/p Right total knee  Patient reports pain as mild.  Patient is up out of bed to a chair. Her pain is well-controlled. She is using incentive spirometry. She has no complaints.  Objective:   VITALS:   Filed Vitals:   07/10/15 2218 07/11/15 0010 07/11/15 0344 07/11/15 0824  BP: 154/56 154/58 164/50 126/54  Pulse:  86 80 81  Temp:  98.8 F (37.1 C) 98.9 F (37.2 C) 98.2 F (36.8 C)  TempSrc:  Oral Oral Oral  Resp:  Height:      Weight:      SpO2:  96% 99% 100%    PHYSICAL EXAM:  Right lower extremity: Patient's dressing is clean dry and intact. I personally removed the hemovac drain.  Patient has palpable pedal pulses, intact sensation light touch and intact motor function in the right lower extremity. She dorsiflex plantarflex her ankle and flex and extend her toes.    LABS  Results for orders placed or performed during the hospital encounter of 07/10/15 (from the past 24 hour(s))  CBC     Status: Abnormal   Collection Time: 07/11/15  8:34 AM  Result Value Ref Range   WBC 13.3 (H) 3.6 - 11.0 K/uL   RBC 3.77 (L) 3.80 - 5.20 MIL/uL   Hemoglobin 12.0 12.0 - 16.0 g/dL   HCT 40.9 81.1 - 91.4 %   MCV 94.1 80.0 - 100.0 fL   MCH 32.0 26.0 - 34.0 pg   MCHC 34.0 32.0 - 36.0 g/dL   RDW 78.2 95.6 - 21.3 %   Platelets 227 150 - 440 K/uL  Basic metabolic panel     Status: Abnormal   Collection Time: 07/11/15  8:34 AM  Result Value Ref Range   Sodium 138 135 - 145 mmol/L   Potassium 5.1 3.5 - 5.1 mmol/L   Chloride 105 101 - 111 mmol/L   CO2 29 22 - 32 mmol/L   Glucose, Bld 132 (H) 65 - 99 mg/dL   BUN 16 6 - 20 mg/dL   Creatinine, Ser 0.86 0.44 - 1.00 mg/dL   Calcium 9.1 8.9 - 57.8 mg/dL   GFR calc non Af Amer >60 >60 mL/min   GFR calc Af Amer >60 >60 mL/min   Anion gap 4 (L) 5 - 15    Dg Knee 1-2 Views Right  07/10/2015  CLINICAL DATA:  Postop right knee arthroplasty. EXAM: RIGHT KNEE - 1-2 VIEW COMPARISON:  None. FINDINGS: Status post right  total knee arthroplasty. The hardware is well positioned. There is no evidence of acute fracture or dislocation. Surgical drains are in place. There is a small amount gas within the joint and anterior soft tissues. Anterior skin staples are noted. IMPRESSION: No demonstrated complication following right total knee arthroplasty. Electronically Signed   By: Carey Bullocks M.D.   On: 07/10/2015 17:49    Assessment/Plan: 1 Day Post-Op   Active Problems:   History of total right knee replacement  I reviewed the patient's labs postoperatively. There is no laboratory abnormality. Her hemoglobin and hematocrit remained stable. Patient is doing well postop. Foley catheter has been removed. She is tolerating physical therapy. She will complete 24 hours of postop antibiotics. Continue current pain management. Continue physical therapy. Patient on Lovenox for DVT prophylaxis along with TED stockings and foot pumps. Patient is encouraged to continue incentive spirometry while awake.    Juanell Fairly , MD 07/11/2015, 1:11 PM

## 2015-07-11 NOTE — Progress Notes (Signed)
CPM applied to rt leg at setting 60 %

## 2015-07-11 NOTE — Anesthesia Postprocedure Evaluation (Signed)
Anesthesia Post Note  Patient: Maria Faulkner  Procedure(s) Performed: Procedure(s) (LRB): TOTAL KNEE ARTHROPLASTY (Right)  Patient location during evaluation: Nursing Unit Anesthesia Type: Spinal Level of consciousness: awake, awake and alert and oriented Pain management: pain level controlled Vital Signs Assessment: post-procedure vital signs reviewed and stable Respiratory status: spontaneous breathing, nonlabored ventilation and respiratory function stable Cardiovascular status: blood pressure returned to baseline and stable Postop Assessment: no headache and no backache Anesthetic complications: no    Last Vitals:  Filed Vitals:   07/11/15 0010 07/11/15 0344  BP: 154/58 164/50  Pulse: 86 80  Temp: 37.1 C 37.2 C  Resp: 18 16    Last Pain:  Filed Vitals:   07/11/15 0409  PainSc: Asleep                 Lyn Records

## 2015-07-11 NOTE — Progress Notes (Signed)
Pt tolerating CPM at 30% , pain controlled with po Meds . Pt ambulating to bathroom using walker and one assist for toileting

## 2015-07-11 NOTE — Progress Notes (Signed)
Clinical Social Worker (CSW) received SNF consult. PT is recommending home health. RN Case Manager is aware of above. Please reconsult if future social work needs arise. CSW signing off.   Dayjah Selman Morgan, LCSW [phone] 

## 2015-07-11 NOTE — Evaluation (Signed)
Occupational Therapy Evaluation Patient Details Name: Maria Faulkner MRN: [id number] DOB: 03/08/1941 Today's Date: 07/11/2015    History of Present Illness Pt underwent R TKR without reported post-op complications. Pt is POD#1 at time of evaluation. No reported falls in the last 12 months.   Clinical Impression   This patient is a 75 year old female who came to [facility] for a R total knee replacement.  Patient lives in a one story home with her husband and had been independent with ADL and functional mobility. She now shows deficits in pain, mobility and activities of daily living  She now requires assistance for lower body dressing and would benefit from Occupational Therapy for ADL/functional mobility training       Follow Up Recommendations   (Home with home health Physical Therapy only, no further Occupational Therapy needed after discharge)    Equipment Recommendations       Recommendations for Other Services       Precautions / Restrictions Precautions Precautions: Knee;Fall Precaution Booklet Issued: Yes (comment) (per PT) Required Braces or Orthoses: Knee Immobilizer - Right Knee Immobilizer - Right: Discontinue once straight leg raise with < 10 degree lag Restrictions Weight Bearing Restrictions: Yes RLE Weight Bearing: Partial weight bearing RLE Partial Weight Bearing Percentage or Pounds: 25-50%      Mobility Bed Mobility  Transfers            Balance                                  ADL                                         General ADL Comments: Had been independent . Today practiced lower body dressing techniques using hip kit as she cannot reach her feet to dress. Patient practiced Donned/doffed socks and pants to knees (drain still in place). Patient requires minimal assist and verbal cues for safety and technique.      Vision     Perception     Praxis      Pertinent Vitals/Pain  Pain Assessment: 0-10 Pain Score: 2  Pain Location: R knee Pain Descriptors / Indicators: Aching Pain Intervention(s): Monitored during session;Premedicated before session;Limited activity within patient's tolerance     Hand Dominance Right   Extremity/Trunk Assessment Upper Extremity Assessment Upper Extremity Assessment: Overall WFL for tasks assessed         Communication Communication Communication: No difficulties   Cognition Arousal/Alertness: Awake/alert Behavior During Therapy: WFL for tasks assessed/performed Overall Cognitive Status: Within Functional Limits for tasks assessed                     General Comments       Exercises Exercises: Total Joint     Shoulder Instructions      Home Living Family/patient expects to be discharged to:: Private residence Living Arrangements: Spouse/significant other Available Help at Discharge: Family Type of Home: House Home Access: Stairs to enter Secretary/administrator of Steps: 3 Entrance Stairs-Rails: Right Home Layout: One level     Bathroom Shower/Tub: Chief Strategy Officer: Handicapped height Bathroom Accessibility: Yes   Home Equipment: Environmental consultant - 2 wheels;Cane - quad;Grab bars - tub/shower          Prior Functioning/Environment Level of  Independence: Independent with assistive device(s)        Comments: occasional use of single point cane.    OT Diagnosis: Acute pain   OT Problem List: Decreased range of motion;Decreased activity tolerance;Impaired balance (sitting and/or standing);Decreased knowledge of use of DME or AE;Pain   OT Treatment/Interventions:      OT Goals(Current goals can be found in the care plan section) Acute Rehab OT Goals Patient Stated Goal: to go home OT Goal Formulation: With patient/family Time For Goal Achievement: 07/25/15 Potential to Achieve Goals: Good  OT Frequency:     Barriers to D/C:            Co-evaluation              End  of Session Equipment Utilized During Treatment:  (hip kit)  Activity Tolerance:   Patient left: in chair;with call bell/phone within reach;with chair alarm set;with family/visitor present   Time: 1025-1045 OT Time Calculation (min): 20 min Charges:  OT General Charges $OT Visit: 1 Procedure OT Evaluation $OT Eval Low Complexity: 1 Procedure OT Treatments $Self Care/Home Management : 8-22 mins G-Codes:    Myrene Galas, MS/OTR/L   07/11/2015, 11:43 AM

## 2015-07-11 NOTE — Care Management Note (Signed)
Case Management Note  Patient Details  Name: TERRIANN DIFONZO MRN: [id number] Date of Birth: 1941/04/03  Subjective/Objective:                  Met with patient and her husband to discuss discharge planning. She plans to go home. She agrees to home health PT but unsure what agency. She uses [facility]  [phone] for Rx. She has a walker available at home for use.   Action/Plan: List of home health agencies left with patient for review. She would like to use [facility]. [place] 5m [phone]. Patient agrees with cost. RNCM will continue to follow.   Expected Discharge Date:                  Expected Discharge Plan:     In-House Referral:     Discharge planning Services  CM Consult  Post Acute Care Choice:  Home Health Choice offered to:  Patient  DME Arranged:    DME Agency:     HH Arranged:  PT HH Agency:     Status of Service:  In process, will continue to follow  Medicare Important Message Given:    Date Medicare IM Given:    Medicare IM give by:    Date Additional Medicare IM Given:    Additional Medicare Important Message give by:     [place] Meet[place], dates discussed:    Additional Comments:  AMarshell Garfinkel RN 07/11/2015, 11:04 AM

## 2015-07-11 NOTE — Evaluation (Signed)
Physical Therapy Evaluation Patient Details Name: Maria Faulkner MRN: [id number] DOB: 1940/10/20 Today's Date: 07/11/2015   History of Present Illness  Pt underwent R TKR without reported post-op complications. Pt is POD#1 at time of evaluation. No reported falls in the last 12 months.  Clinical Impression  Pt requires assist with bed mobility but demonstrates good strength with transfers and ambulation. KI donned for all mobility. Pt able to complete all bed exercises as instructed. She requires assist for SLR and LAQ due to weakness. Pt will benefit from [facility] PT at discharge. No DME needs currently. Pt will benefit from skilled PT services to address deficits in strength, balance, and mobility in order to return to full function at home.     Follow Up Recommendations Home health PT    Equipment Recommendations  None recommended by PT    Recommendations for Other Services       Precautions / Restrictions Precautions Precautions: Knee;Fall Precaution Booklet Issued: Yes (comment) Required Braces or Orthoses: Knee Immobilizer - Right Knee Immobilizer - Right: Discontinue once straight leg raise with < 10 degree lag Restrictions Weight Bearing Restrictions: Yes RLE Weight Bearing: Partial weight bearing RLE Partial Weight Bearing Percentage or Pounds: 25-50%      Mobility  Bed Mobility Overal bed mobility: Needs Assistance Bed Mobility: Supine to Sit     Supine to sit: Min assist     General bed mobility comments: Pt requires assist for torso to go from supine to sitting. Minimal assistance to adduct RLE when exiting bed to the L. Once upright in sitting pt able to independently scoot toward HOB.  Transfers Overall transfer level: Needs assistance Equipment used: Rolling walker (2 wheeled) Transfers: Sit to/from Stand Sit to Stand: Min guard         General transfer comment: Pt provided education regarding PWB status and safe hand placement during transferse. KI  donned for all mobility. Pt able to demonstrate good LLE strength and ability to maintain PWB restrictions. Good stability when upright in standing  Ambulation/Gait Ambulation/Gait assistance: Min guard Ambulation Distance (Feet): 25 Feet Assistive device: Rolling walker (2 wheeled) Gait Pattern/deviations: Step-to pattern;Decreased step length - left;Decreased stance time - right;Decreased weight shift to right Gait velocity: Decreased Gait velocity interpretation: <1.8 ft/sec, indicative of risk for recurrent falls General Gait Details: Pt demonstrates good sequencing with rolling walker following education. Able to maintain PWB on RLE. Gait speed is slow but appropriate for POD#1. Pt reports minimal increase in pain with ambulation. Good stability noted without safety concerns  Stairs            Wheelchair Mobility    Modified Rankin (Stroke Patients Only)       Balance Overall balance assessment: Needs assistance Sitting-balance support: No upper extremity supported Sitting balance-Leahy Scale: Good     Standing balance support: Bilateral upper extremity supported Standing balance-Leahy Scale: Fair                               Pertinent Vitals/Pain Pain Assessment: 0-10 Pain Score: 2  Pain Location: R knee Pain Descriptors / Indicators: Aching Pain Intervention(s): Monitored during session;Premedicated before session;Limited activity within patient's tolerance    Home Living Family/patient expects to be discharged to:: Private residence Living Arrangements: Spouse/significant other Available Help at Discharge: Family Type of Home: House Home Access: Stairs to enter Entrance Stairs-Rails: Right Entrance Stairs-Number of Steps: 3 (Could also enter front, 1+1 step with  bilateral rails) Home Layout: One level Home Equipment: Walker - 2 wheels;Cane - quad;Grab bars - tub/shower (no shower chair, no BSC)      Prior Function Level of Independence:  Independent with assistive device(s)         Comments: Occasional use of single point cane. Limited community ambulation.     Hand Dominance   Dominant Hand: Right    Extremity/Trunk Assessment   Upper Extremity Assessment: Overall WFL for tasks assessed           Lower Extremity Assessment: RLE deficits/detail RLE Deficits / Details: LLE grossly WFL. RLE: 2 finger assist for SLR, assist for LAQ. Full DF/PF. Pt reports full sensation to light touch       Communication   Communication: No difficulties  Cognition Arousal/Alertness: Awake/alert Behavior During Therapy: WFL for tasks assessed/performed Overall Cognitive Status: Within Functional Limits for tasks assessed                      General Comments      Exercises Total Joint Exercises Ankle Circles/Pumps: Strengthening;Both;10 reps;Supine Quad Sets: Strengthening;Both;10 reps;Supine Gluteal Sets: Strengthening;Both;10 reps;Supine Towel Squeeze: Strengthening;Both;10 reps;Supine Hip ABduction/ADduction: Strengthening;Right;10 reps;Supine Straight Leg Raises: Strengthening;Right;10 reps;Supine Long Arc Quad: Strengthening;Right;10 reps;Seated Goniometric ROM: -7 to 62 degrees AAROM, pain limited      Assessment/Plan    PT Assessment Patient needs continued PT services  PT Diagnosis Difficulty walking;Abnormality of gait;Generalized weakness   PT Problem List Decreased strength;Decreased range of motion;Decreased activity tolerance;Decreased balance;Decreased mobility;Decreased knowledge of use of DME;Pain  PT Treatment Interventions DME instruction;Gait training;Stair training;Therapeutic activities;Therapeutic exercise;Balance training;Neuromuscular re-education;Patient/family education;Manual techniques   PT Goals (Current goals can be found in the Care Plan section) Acute Rehab PT Goals Patient Stated Goal: Improve function and decrease pain with ambulation PT Goal Formulation: With  patient Time For Goal Achievement: 07/25/15 Potential to Achieve Goals: Good    Frequency BID   Barriers to discharge        Co-evaluation               End of Session Equipment Utilized During Treatment: Gait belt Activity Tolerance: Patient tolerated treatment well Patient left: in chair;with call bell/phone within reach;with chair alarm set;with SCD's reapplied (polar care in place, towel roll under heel, KI donned) Nurse Communication: Mobility status         Time: 0454-0981 PT Time Calculation (min) (ACUTE ONLY): 35 min   Charges:   PT Evaluation $PT Eval Moderate Complexity: 1 Procedure PT Treatments $Therapeutic Exercise: 8-22 mins   PT G Codes:       Sharalyn Ink Huprich PT, DPT   Huprich,Jason 07/11/2015, 10:49 AM

## 2015-07-11 NOTE — Progress Notes (Signed)
Pt tolerated being dangled at bedside 

## 2015-07-12 MED ORDER — OXYCODONE HCL 5 MG PO TABS: 5.0000 mg | ORAL_TABLET | ORAL | Status: DC | PRN

## 2015-07-12 NOTE — Care Management (Addendum)
Per patient and RN patient will transition to aspirin. Lovenox cancelled with [facility] pharmacy. Barbara Cower with [facility] notified that patient discharge to home today.

## 2015-07-12 NOTE — Progress Notes (Signed)
Physical Therapy Treatment Patient Details Name: Maria Faulkner MRN: [id number] DOB: July 15, 1940 Today's Date: 07/12/2015    History of Present Illness Pt underwent R TKR without reported post-op complications. No reported falls in the last 12 months.    PT Comments    Pt is able to ambulate well with a little bit of warm up time after doing well with exercises and showing ability to do 10 SLRs without assist.  She is able to get to EOB with only CGA but does rely heavily on bed rails and needing some extra time.  Pt ultimately making good gains.  Follow Up Recommendations  Home health PT     Equipment Recommendations  None recommended by PT    Recommendations for Other Services       Precautions / Restrictions Precautions Precautions: Knee;Fall Required Braces or Orthoses:  (pt able to do SLRs, KI no longer needed) Restrictions Weight Bearing Restrictions: Yes RLE Weight Bearing: Partial weight bearing    Mobility  Bed Mobility Overal bed mobility: Needs Assistance Bed Mobility: Sit to Supine     Supine to sit: Min guard;HOB elevated (use of hand rails)     General bed mobility comments: Pt shows good effort with getting to EOB and with cuing and encouragement is able to get to upright without direct assist  Transfers Overall transfer level: Needs assistance Equipment used: Rolling walker (2 wheeled) Transfers: Sit to/from Stand Sit to Stand: Min guard         General transfer comment: Pt is able to get to standing without heavy assistive device, but   Ambulation/Gait Ambulation/Gait assistance: Min guard Ambulation Distance (Feet): 100 Feet Assistive device: Rolling walker (2 wheeled)       General Gait Details: Pt starts out slowly with some hesitation, but shows increased confidence and cadence and ultimately is able to ambulate efficiently and safely.    Stairs            Wheelchair Mobility    Modified Rankin (Stroke Patients Only)        Balance                                    Cognition Arousal/Alertness: Awake/alert Behavior During Therapy: WFL for tasks assessed/performed Overall Cognitive Status: Within Functional Limits for tasks assessed                      Exercises Total Joint Exercises Ankle Circles/Pumps: AROM;10 reps Quad Sets: Strengthening;10 reps Gluteal Sets: Strengthening;10 reps Heel Slides: AAROM;10 reps Hip ABduction/ADduction: Strengthening;10 reps Straight Leg Raises: 10 reps;AROM Knee Flexion: PROM;5 reps Goniometric ROM: 3-82    General Comments        Pertinent Vitals/Pain Pain Assessment: 0-10 Pain Score: 6     Home Living                      Prior Function            PT Goals (current goals can now be found in the care plan section) Progress towards PT goals: Progressing toward goals    Frequency  BID    PT Plan Current plan remains appropriate    Co-evaluation             End of Session Equipment Utilized During Treatment: Gait belt Activity Tolerance: Patient tolerated treatment well Patient left: with chair alarm set;with call bell/phone  within reach     Time: 0814-0838 PT Time Calculation (min) (ACUTE ONLY): 24 min  Charges:  $Gait Training: 8-22 mins $Therapeutic Exercise: 8-22 mins                    G Codes:     Loran Senters, PT, DPT [phone]  Malachi Pro 07/12/2015, 11:49 AM

## 2015-07-12 NOTE — Progress Notes (Signed)
Pt discharged home with home health. Discharge instructions given all questions answered. Patient husband present when discharge instructions given.

## 2015-07-12 NOTE — Discharge Summary (Signed)
Physician Discharge Summary  Patient ID: Maria Faulkner MRN: [id number] DOB/AGE: [birth year] 75 y.o.  Admit date: 07/10/2015 Discharge date: 07/12/2015  Admission Diagnoses:  UNILATERAL RIGHT KNEE PRIMARY OSTEOARTHRITIS   Discharge Diagnoses:  UNILATERAL RIGHT KNEE PRIMARY OSTEOARTHRITIS Active Problems:   History of total right knee replacement   Past Medical History  Diagnosis Date  . Hypertension   . Arthritis   . Hypercholesteremia     Surgeries: Procedure(s): TOTAL KNEE ARTHROPLASTY on 07/10/2015   Consultants (if any):    Discharged Condition: Improved  Hospital Course: Maria Faulkner is an 75 y.o. female who was admitted 07/10/2015 with a diagnosis of  UNILATERAL RIGHT KNEE PRIMARY OSTEOARTHRITIS  and went to the operating room on 07/10/2015 and underwent a right total knee arthroplasty.    She was given perioperative antibiotics:  Anti-infectives    Start     Dose/Rate Route Frequency Ordered Stop   07/10/15 1930  ceFAZolin (ANCEF) IVPB 2 g/50 mL premix     2 g 100 mL/hr over 30 Minutes Intravenous Every 6 hours 07/10/15 1755 07/11/15 0316   07/10/15 1215  ceFAZolin (ANCEF) IVPB 2 g/50 mL premix     2 g 100 mL/hr over 30 Minutes Intravenous  Once 07/10/15 1210 07/10/15 1345    . Patient did very well postoperatively. She was up out of bed to a chair on postoperative day #1. Her Foley catheter was removed on postop day #1. She completed 24 hours of postop antibiotics. Postoperative labs drawn on postop day 1 showed a stable hemoglobin and hematocrit. Patient's pain was well controlled on oral narcotics. On postoperative day #2 patient's dressing was changed. Her incision was clean dry and intact. There is no severe and swelling in the right knee. Given her clinical improvement she is prepared for discharge home.  She was given sequential compression devices, early ambulation, and lovenox  for DVT prophylaxis.  She benefited maximally from the hospital stay and  there were no complications.    Recent vital signs:  Filed Vitals:   07/12/15 0512 07/12/15 0752  BP: 104/45 126/41  Pulse: 88 87  Temp: 98.1 F (36.7 C) 99.5 F (37.5 C)  Resp: 19 18    Recent laboratory studies:  Lab Results  Component Value Date   HGB 12.0 07/11/2015   HGB 14.7 06/26/2015   Lab Results  Component Value Date   WBC 13.3* 07/11/2015   PLT 227 07/11/2015   Lab Results  Component Value Date   INR 1.05 06/26/2015   Lab Results  Component Value Date   NA 138 07/11/2015   K 5.1 07/11/2015   CL 105 07/11/2015   CO2 29 07/11/2015   BUN 16 07/11/2015   CREATININE 0.76 07/11/2015   GLUCOSE 132* 07/11/2015    Discharge Medications:     Medication List    TAKE these medications        acetaminophen 650 MG CR tablet  Commonly known as:  TYLENOL  Take 650 mg by mouth every 8 (eight) hours as needed for pain.     aspirin 81 MG tablet  Take 81 mg by mouth every morning.     atorvastatin 10 MG tablet  Commonly known as:  LIPITOR  Take 10 mg by mouth every morning.     lisinopril-hydrochlorothiazide 10-12.5 MG tablet  Commonly known as:  PRINZIDE,ZESTORETIC  Take 1 tablet by mouth daily.     Magnesium 500 MG Caps  Take 500 mg by mouth at bedtime.  multivitamin tablet  Take 1 tablet by mouth every morning.     oxyCODONE 5 MG immediate release tablet  Commonly known as:  Oxy IR/ROXICODONE  Take 1-2 tablets (5-10 mg total) by mouth every 3 (three) hours as needed for breakthrough pain.     POTASSIUM CHLORIDE ER PO  Take 8 mEq by mouth every morning.        Diagnostic Studies: Dg Chest 2 View  06/26/2015  CLINICAL DATA:  Preop for knee replacement scheduled for July 10, 2015, former smoker EXAM: CHEST  2 VIEW COMPARISON:  None. FINDINGS: The heart size and mediastinal contours are within normal limits. Both lungs are clear. The visualized skeletal structures are unremarkable. IMPRESSION: No active cardiopulmonary disease. Electronically  Signed   By: Esperanza Heir M.D.   On: 06/26/2015 15:47   Dg Knee 1-2 Views Right  07/10/2015  CLINICAL DATA:  Postop right knee arthroplasty. EXAM: RIGHT KNEE - 1-2 VIEW COMPARISON:  None. FINDINGS: Status post right total knee arthroplasty. The hardware is well positioned. There is no evidence of acute fracture or dislocation. Surgical drains are in place. There is a small amount gas within the joint and anterior soft tissues. Anterior skin staples are noted. IMPRESSION: No demonstrated complication following right total knee arthroplasty. Electronically Signed   By: Carey Bullocks M.D.   On: 07/10/2015 17:49    Disposition: Home with home health      Discharge Instructions    Call MD / Call 911    Complete by:  As directed   If you experience chest pain or shortness of breath, CALL 911 and be transported to the hospital emergency room.  If you develope a fever above 101 F, pus (white drainage) or increased drainage or redness at the wound, or calf pain, call your surgeon's office.     Constipation Prevention    Complete by:  As directed   Drink plenty of fluids.  Prune juice may be helpful.  You may use a stool softener, such as Colace (over the counter) 100 mg twice a day.  Use MiraLax (over the counter) for constipation as needed.     Diet - low sodium heart healthy    Complete by:  As directed      Discharge instructions    Complete by:  As directed   Continue 50% partial weightbearing on the right lower extremity 1 month postop. Continue to use TED stockings until follow-up. Patient may remove them at night for sleep. Elevate the right lower extremity whenever possible. Continue to use knee immobilizer at night or when lying in bed or when elevating the operative leg. The patient may remove the knee immobilizer to perform exercises or sit in a chair. Continue using the TENS unit and Polar Care for comfort. Keep incision clean and dry. Cover the right knee incision during showers with a  plastic bag or Saran wrap. Take enteric coated aspirin  PO BID a day for blood clot prevention. Continue to work on knee range of motion exercises at home as instructed by physical therapy. Continue to use a walker for assistance with ambulation until follow-up.     Do not put a pillow under the knee. Place it under the heel.    Complete by:  As directed      Driving restrictions    Complete by:  As directed   No driving for 3-08 weeks     Increase activity slowly as tolerated    Complete by:  As directed      Lifting restrictions    Complete by:  As directed   No lifting for 12 weeks     TED hose    Complete by:  As directed   Use stockings (TED hose) for 2 weeks on both leg(s).  You may remove them at night for sleeping.              Signed:      Juanell Fairly ,MD 07/12/2015, 2:50 PM

## 2015-07-12 NOTE — Care Management Important Message (Signed)
Important Message  Patient Details  Name: Maria Faulkner MRN: [id number] Date of Birth: 02-14-1941   Medicare Important Message Given:  Yes    Collie Siad, RN 07/12/2015, 9:29 AM

## 2015-07-12 NOTE — Progress Notes (Signed)
  Subjective:  POD #2  S/p right total knee arthroplasty.  Patient reports pain as mild.  Patient is up out of bed to a chair. She states she was doing well with her mini session of physical therapy. She has no other complaints.  Objective:   VITALS:   Filed Vitals:   07/11/15 1558 07/11/15 2103 07/12/15 0512 07/12/15 0752  BP: 137/48 111/45 104/45 126/41  Pulse: 81 80 88 87  Temp: 98 F (36.7 C) 97.9 F (36.6 C) 98.1 F (36.7 C) 99.5 F (37.5 C)  TempSrc: Oral Oral Oral Oral  Resp:  Height:      Weight:      SpO2: 100% 98% 99% 94%    PHYSICAL EXAM:  Right knee: Patient's dressing was changed by me personally today. A new honeycomb dressing was applied to the anterior knee. There is minimal sanguinous drainage from the inferior pole of her incision. There is no significant swelling or hemarthrosis in the knee. Patient has active quad contraction. She has palpable pedal pulses, intact sensation light touch and intact motor function throughout the right lower extremity. She had no calf tenderness or lower leg edema.   LABS  No results found for this or any previous visit (from the past 24 hour(s)).  Dg Knee 1-2 Views Right  07/10/2015  CLINICAL DATA:  Postop right knee arthroplasty. EXAM: RIGHT KNEE - 1-2 VIEW COMPARISON:  None. FINDINGS: Status post right total knee arthroplasty. The hardware is well positioned. There is no evidence of acute fracture or dislocation. Surgical drains are in place. There is a small amount gas within the joint and anterior soft tissues. Anterior skin staples are noted. IMPRESSION: No demonstrated complication following right total knee arthroplasty. Electronically Signed   By: Carey Bullocks M.D.   On: 07/10/2015 17:49    Assessment/Plan: 2 Days Post-Op   Active Problems:   History of total right knee replacement  Patient is doing well postop. She is making excellent progress with physical therapy. Her pain is well-controlled. She  feels comfortable going home today. She is set up with home PT which will begin tomorrow. Patient will continue wearing her TED stockings for 2 weeks until follow-up. She should continue to elevate her right lower extremity and use of Polar Care and TENS unit. She will follow up with me in 10-14 days in the office.    Juanell Fairly , MD 07/12/2015, 2:39 PM

## 2015-07-12 NOTE — Progress Notes (Signed)
Physical Therapy Treatment Patient Details Name: Maria Faulkner MRN: [id number] DOB: [birth year] Today's Date: 07/12/2015    History of Present Illness Pt underwent R TKR without reported post-op complications. No reported falls in the last 12 months.    PT Comments    Pt continues to do well showing good strength, ROM, mobility and was able to negotiate steps this afternoon.  Pt has post-op pain as expected, but it is not at all severe or limiting.  She shows minimal fatigue with over 100 ft of ambulation and generally is at or above expected post-op levels at this time.    Follow Up Recommendations  Home health PT     Equipment Recommendations  None recommended by PT    Recommendations for Other Services       Precautions / Restrictions Restrictions RLE Weight Bearing: Partial weight bearing    Mobility  Bed Mobility Overal bed mobility: Needs Assistance Bed Mobility: Sit to Supine;Supine to Sit       Sit to supine: Min guard   General bed mobility comments: Pt able to get R LE up into bed with only CGA  Transfers Overall transfer level: Modified independent Equipment used: Rolling walker (2 wheeled) Transfers: Sit to/from Stand Sit to Stand: Min guard         General transfer comment: Pt does well with sit/stand transfer with good confidence.  She needs cues to out UE back to control descent.   Ambulation/Gait Ambulation/Gait assistance: Min guard Ambulation Distance (Feet): 150 Feet Assistive device: Rolling walker (2 wheeled)       General Gait Details: Pt again does very well with ambualtion showing good confidece, ability to maintain forward momentum and only minimal c/o fatigue.    Stairs Stairs: Yes Stairs assistance: Min guard Stair Management: One rail Right Number of Stairs: 3 General stair comments: Pt able to negotiate up/down steps with side step strategy and good safety.  Pt reports she feels she can get into her home with only CGA from  family.  Wheelchair Mobility    Modified Rankin (Stroke Patients Only)       Balance                                    Cognition Arousal/Alertness: Awake/alert Behavior During Therapy: WFL for tasks assessed/performed Overall Cognitive Status: Within Functional Limits for tasks assessed                      Exercises Total Joint Exercises Ankle Circles/Pumps: AROM;10 reps Quad Sets: Strengthening;10 reps Gluteal Sets: Strengthening;10 reps Short Arc Quad: AROM;10 reps;Right Heel Slides: AAROM;10 reps Hip ABduction/ADduction: Strengthening;10 reps Straight Leg Raises: 10 reps;AROM    General Comments        Pertinent Vitals/Pain Pain Score: 4     Home Living                      Prior Function            PT Goals (current goals can now be found in the care plan section) Progress towards PT goals: Progressing toward goals    Frequency  BID    PT Plan Current plan remains appropriate    Co-evaluation             End of Session Equipment Utilized During Treatment: Gait belt Activity Tolerance: Patient tolerated treatment well Patient left:  with call bell/phone within reach;with bed alarm set     Time: 1351-1416 PT Time Calculation (min) (ACUTE ONLY): 25 min  Charges:  $Gait Training: 8-22 mins $Therapeutic Exercise: 8-22 mins                    G Codes:     Loran Senters, PT, DPT [phone]  Malachi Pro 07/12/2015, 5:37 PM

## 2016-02-21 ENCOUNTER — Other Ambulatory Visit: Payer: Self-pay | Admitting: Orthopedic Surgery

## 2016-02-24 ENCOUNTER — Encounter
Admission: RE | Admit: 2016-02-24 | Discharge: 2016-02-24 | Disposition: A | Discharge status: Home / Self Care | Payer: Medicare Other | Source: Ambulatory Visit | Attending: Orthopedic Surgery | Admitting: Orthopedic Surgery

## 2016-02-24 DIAGNOSIS — Z0183 Encounter for blood typing: Secondary | ICD-10-CM | POA: Insufficient documentation

## 2016-02-24 DIAGNOSIS — M1712 Unilateral primary osteoarthritis, left knee: Secondary | ICD-10-CM | POA: Diagnosis not present

## 2016-02-24 DIAGNOSIS — Z01812 Encounter for preprocedural laboratory examination: Secondary | ICD-10-CM | POA: Insufficient documentation

## 2016-02-24 HISTORY — DX: Anxiety disorder, unspecified: F41.9

## 2016-02-24 LAB — URINALYSIS COMPLETE WITH MICROSCOPIC (ARMC ONLY)
Bacteria, UA: NONE SEEN
Bilirubin Urine: NEGATIVE
Glucose, UA: NEGATIVE mg/dL
HGB URINE DIPSTICK: NEGATIVE
KETONES UR: NEGATIVE mg/dL
LEUKOCYTES UA: NEGATIVE
Nitrite: NEGATIVE
PH: 6 (ref 5.0–8.0)
PROTEIN: NEGATIVE mg/dL
SPECIFIC GRAVITY, URINE: 1.009 (ref 1.005–1.030)
Squamous Epithelial / LPF: NONE SEEN

## 2016-02-24 LAB — SURGICAL PCR SCREEN
MRSA, PCR: NEGATIVE
Staphylococcus aureus: NEGATIVE

## 2016-02-24 LAB — CBC WITH DIFFERENTIAL/PLATELET
BASOS PCT: 0 %
Basophils Absolute: 0 10*3/uL (ref 0–0.1)
Eosinophils Absolute: 0.2 10*3/uL (ref 0–0.7)
Eosinophils Relative: 3 %
HEMATOCRIT: 39.1 % (ref 35.0–47.0)
HEMOGLOBIN: 13.9 g/dL (ref 12.0–16.0)
LYMPHS PCT: 45 %
Lymphs Abs: 3 10*3/uL (ref 1.0–3.6)
MCH: 32 pg (ref 26.0–34.0)
MCHC: 35.6 g/dL (ref 32.0–36.0)
MCV: 89.8 fL (ref 80.0–100.0)
MONOS PCT: 7 %
Monocytes Absolute: 0.5 10*3/uL (ref 0.2–0.9)
NEUTROS ABS: 3 10*3/uL (ref 1.4–6.5)
NEUTROS PCT: 45 %
PLATELETS: 239 10*3/uL (ref 150–440)
RBC: 4.36 MIL/uL (ref 3.80–5.20)
RDW: 12.5 % (ref 11.5–14.5)
WBC: 6.7 10*3/uL (ref 3.6–11.0)

## 2016-02-24 LAB — TYPE AND SCREEN
ABO/RH(D): O POS
Antibody Screen: NEGATIVE

## 2016-02-24 LAB — BASIC METABOLIC PANEL
Anion gap: 6 (ref 5–15)
BUN: 20 mg/dL (ref 6–20)
CHLORIDE: 107 mmol/L (ref 101–111)
CO2: 28 mmol/L (ref 22–32)
CREATININE: 0.61 mg/dL (ref 0.44–1.00)
Calcium: 9.9 mg/dL (ref 8.9–10.3)
GFR calc Af Amer: >60 mL/min (ref >60)
GFR calc non Af Amer: >60 mL/min (ref >60)
Glucose, Bld: 102 mg/dL — ABNORMAL HIGH (ref 65–99)
Potassium: 4.6 mmol/L (ref 3.5–5.1)
SODIUM: 141 mmol/L (ref 135–145)

## 2016-02-24 LAB — PROTIME-INR
INR: 1.01
Prothrombin Time: 13.3 seconds (ref 11.4–15.2)

## 2016-02-24 LAB — APTT: aPTT: 27 seconds (ref 24–36)

## 2016-02-24 LAB — HEMOGLOBIN A1C: HEMOGLOBIN A1C: 5.2 % (ref 4.0–6.0)

## 2016-02-24 MED ORDER — CHLORHEXIDINE GLUCONATE 4 % EX LIQD
60.0000 mL | Freq: Once | CUTANEOUS | Status: DC
  Filled 2016-02-24: qty 60

## 2016-02-24 NOTE — Patient Instructions (Signed)
  Your procedure is scheduled on: 03/05/16 Thurs Report to Same Day Surgery 2nd floor medical mall To find out your arrival time please call [phone] between 1PM - 3PM on 03/04/16 Wed  Remember: Instructions that are not followed completely may result in serious medical risk, up to and including death, or upon the discretion of your surgeon and anesthesiologist your surgery may need to be rescheduled.    _x___ 1. Do not eat food or drink liquids after midnight. No gum chewing or hard candies.     __x__ 2. No Alcohol for 24 hours before or after surgery.   __x__3. No Smoking for 24 prior to surgery.   ____  4. Bring all medications with you on the day of surgery if instructed.    __x__ 5. Notify your doctor if there is any change in your medical condition     (cold, fever, infections).     Do not wear jewelry, make-up, hairpins, clips or nail polish.  Do not wear lotions, powders, or perfumes. You may wear deodorant.  Do not shave 48 hours prior to surgery. Men may shave face and neck.  Do not bring valuables to the hospital.    [facility] is not responsible for any belongings or valuables.               Contacts, dentures or bridgework may not be worn into surgery.  Leave your suitcase in the car. After surgery it may be brought to your room.  For patients admitted to the hospital, discharge time is determined by your treatment team.   Patients discharged the day of surgery will not be allowed to drive home.    Please read over the following fact sheets that you were given:   [facility] Preparing for Surgery and or MRSA Information   _x___ Take these medicines the morning of surgery with A SIP OF WATER:    1. atorvastatin (LIPITOR) 10 MG tablet  2.  3.  4.  5.  6.  ____ Fleet Enema (as directed)   _x___ Use CHG Soap or sage wipes as directed on instruction sheet   ____ Use inhalers on the day of surgery and bring to hospital day of surgery  ____ Stop metformin 2  days prior to surgery    ____ Take 1/2 of usual insulin dose the night before surgery and none on the morning of           surgery.   __x__ Stop aspirin or coumadin, or plavix  Stop aspirin 1 week before surgery  _x__ Stop Anti-inflammatories such as Advil, Aleve, Ibuprofen, Motrin, Naproxen,          Naprosyn, Goodies powders or aspirin products. Ok to take Tylenol.   _x___ Stop supplements until after surgery.  Stop Vitamin E and fish oil 1 week before surgery  ____ Bring C-Pap to the hospital.

## 2016-03-04 MED ORDER — CEFAZOLIN SODIUM-DEXTROSE 2-4 GM/100ML-% IV SOLN
2.0000 g | INTRAVENOUS | Status: AC
  Administered 2016-03-05: 2 g via INTRAVENOUS

## 2016-03-04 MED ORDER — CLINDAMYCIN PHOSPHATE 600 MG/50ML IV SOLN
600.0000 mg | Freq: Once | INTRAVENOUS | Status: AC
  Administered 2016-03-05: 600 mg via INTRAVENOUS

## 2016-03-05 ENCOUNTER — Inpatient Hospital Stay: Payer: Medicare Other

## 2016-03-05 ENCOUNTER — Encounter
Admission: RE | Disposition: A | Discharge status: Home Health | Payer: Self-pay | Source: Ambulatory Visit | Attending: Orthopedic Surgery

## 2016-03-05 ENCOUNTER — Inpatient Hospital Stay
Admission: RE | Admit: 2016-03-05 | Discharge: 2016-03-08 | DRG: 470 | Disposition: A | Discharge status: Home Health | Payer: Medicare Other | Source: Ambulatory Visit | Attending: Orthopedic Surgery | Admitting: Orthopedic Surgery

## 2016-03-05 ENCOUNTER — Encounter: Payer: Self-pay | Admitting: *Deleted

## 2016-03-05 ENCOUNTER — Inpatient Hospital Stay: Payer: Medicare Other | Admitting: Anesthesiology

## 2016-03-05 DIAGNOSIS — R262 Difficulty in walking, not elsewhere classified: Secondary | ICD-10-CM

## 2016-03-05 DIAGNOSIS — M25562 Pain in left knee: Secondary | ICD-10-CM

## 2016-03-05 DIAGNOSIS — I9581 Postprocedural hypotension: Secondary | ICD-10-CM | POA: Diagnosis not present

## 2016-03-05 DIAGNOSIS — Z96652 Presence of left artificial knee joint: Secondary | ICD-10-CM

## 2016-03-05 DIAGNOSIS — I10 Essential (primary) hypertension: Secondary | ICD-10-CM | POA: Diagnosis present

## 2016-03-05 DIAGNOSIS — Z23 Encounter for immunization: Secondary | ICD-10-CM | POA: Diagnosis not present

## 2016-03-05 DIAGNOSIS — M1712 Unilateral primary osteoarthritis, left knee: Principal | ICD-10-CM | POA: Diagnosis present

## 2016-03-05 DIAGNOSIS — Z7982 Long term (current) use of aspirin: Secondary | ICD-10-CM | POA: Diagnosis not present

## 2016-03-05 DIAGNOSIS — E785 Hyperlipidemia, unspecified: Secondary | ICD-10-CM | POA: Diagnosis present

## 2016-03-05 DIAGNOSIS — M6281 Muscle weakness (generalized): Secondary | ICD-10-CM

## 2016-03-05 DIAGNOSIS — Z79899 Other long term (current) drug therapy: Secondary | ICD-10-CM

## 2016-03-05 DIAGNOSIS — Z87891 Personal history of nicotine dependence: Secondary | ICD-10-CM

## 2016-03-05 DIAGNOSIS — Z96659 Presence of unspecified artificial knee joint: Secondary | ICD-10-CM

## 2016-03-05 HISTORY — PX: TOTAL KNEE ARTHROPLASTY: SHX125

## 2016-03-05 SURGERY — ARTHROPLASTY, KNEE, TOTAL
Anesthesia: Spinal | Site: Knee | Laterality: Left | Wound class: Clean

## 2016-03-05 MED ORDER — POTASSIUM CHLORIDE ER 8 MEQ PO TBCR
8.0000 meq | EXTENDED_RELEASE_TABLET | Freq: Every day | ORAL | Status: DC
  Administered 2016-03-05 – 2016-03-08 (×4): 8 meq via ORAL
  Filled 2016-03-05 (×8): qty 1

## 2016-03-05 MED ORDER — VITAMIN E 180 MG (400 UNIT) PO CAPS
400.0000 [IU] | ORAL_CAPSULE | Freq: Every day | ORAL | Status: DC
  Administered 2016-03-05 – 2016-03-08 (×4): 400 [IU] via ORAL
  Filled 2016-03-05 (×4): qty 1

## 2016-03-05 MED ORDER — OXYCODONE HCL 5 MG PO TABS
10.0000 mg | ORAL_TABLET | ORAL | Status: DC | PRN
  Administered 2016-03-05 – 2016-03-06 (×4): 15 mg via ORAL
  Filled 2016-03-05 (×4): qty 3

## 2016-03-05 MED ORDER — METHOCARBAMOL 500 MG PO TABS
500.0000 mg | ORAL_TABLET | Freq: Four times a day (QID) | ORAL | Status: DC | PRN
  Administered 2016-03-07: 500 mg via ORAL
  Filled 2016-03-05: qty 1

## 2016-03-05 MED ORDER — BUPIVACAINE LIPOSOME 1.3 % IJ SUSP
INTRAMUSCULAR | Status: DC | PRN
  Administered 2016-03-05: 120 mL

## 2016-03-05 MED ORDER — ASPIRIN EC 81 MG PO TBEC
81.0000 mg | DELAYED_RELEASE_TABLET | Freq: Every day | ORAL | Status: DC
  Administered 2016-03-05 – 2016-03-08 (×4): 81 mg via ORAL
  Filled 2016-03-05 (×6): qty 1

## 2016-03-05 MED ORDER — ONDANSETRON HCL 4 MG/2ML IJ SOLN
INTRAMUSCULAR | Status: DC | PRN
  Administered 2016-03-05: 4 mg via INTRAVENOUS

## 2016-03-05 MED ORDER — HYDROCHLOROTHIAZIDE 12.5 MG PO CAPS: 12.5000 mg | ORAL_CAPSULE | Freq: Every day | ORAL | Status: DC

## 2016-03-05 MED ORDER — MAGNESIUM HYDROXIDE 400 MG/5ML PO SUSP
30.0000 mL | Freq: Every day | ORAL | Status: DC | PRN
  Administered 2016-03-07: 30 mL via ORAL
  Filled 2016-03-05: qty 30

## 2016-03-05 MED ORDER — ACETAMINOPHEN 650 MG RE SUPP: 650.0000 mg | Freq: Four times a day (QID) | RECTAL | Status: DC | PRN

## 2016-03-05 MED ORDER — HYDROMORPHONE HCL 1 MG/ML IJ SOLN
1.0000 mg | INTRAMUSCULAR | Status: DC | PRN
  Administered 2016-03-05 (×2): 1 mg via INTRAVENOUS
  Filled 2016-03-05 (×2): qty 1

## 2016-03-05 MED ORDER — ATORVASTATIN CALCIUM 10 MG PO TABS
10.0000 mg | ORAL_TABLET | Freq: Every day | ORAL | Status: DC
  Administered 2016-03-05 – 2016-03-08 (×4): 10 mg via ORAL
  Filled 2016-03-05 (×4): qty 1

## 2016-03-05 MED ORDER — ALUM & MAG HYDROXIDE-SIMETH 200-200-20 MG/5ML PO SUSP: 30.0000 mL | ORAL | Status: DC | PRN

## 2016-03-05 MED ORDER — NEOMYCIN-POLYMYXIN B GU 40-200000 IR SOLN
Status: AC
  Filled 2016-03-05: qty 20

## 2016-03-05 MED ORDER — PHENOL 1.4 % MT LIQD
1.0000 | OROMUCOSAL | Status: DC | PRN
  Filled 2016-03-05: qty 177

## 2016-03-05 MED ORDER — INFLUENZA VAC SPLIT QUAD 0.5 ML IM SUSY
0.5000 mL | PREFILLED_SYRINGE | INTRAMUSCULAR | Status: AC
  Administered 2016-03-06: 0.5 mL via INTRAMUSCULAR
  Filled 2016-03-05: qty 0.5

## 2016-03-05 MED ORDER — PROPOFOL 500 MG/50ML IV EMUL
INTRAVENOUS | Status: DC | PRN
  Administered 2016-03-05: 40 ug/kg/min via INTRAVENOUS

## 2016-03-05 MED ORDER — BUPIVACAINE LIPOSOME 1.3 % IJ SUSP
INTRAMUSCULAR | Status: AC
  Filled 2016-03-05: qty 20

## 2016-03-05 MED ORDER — KETAMINE HCL 50 MG/ML IJ SOLN
INTRAMUSCULAR | Status: DC | PRN
  Administered 2016-03-05: 50 mg via INTRAMUSCULAR

## 2016-03-05 MED ORDER — LISINOPRIL 10 MG PO TABS: 10.0000 mg | ORAL_TABLET | Freq: Every day | ORAL | Status: DC

## 2016-03-05 MED ORDER — LACTATED RINGERS IV SOLN
INTRAVENOUS | Status: DC
  Administered 2016-03-05 (×2): via INTRAVENOUS
  Administered 2016-03-05: 50 mL/h via INTRAVENOUS

## 2016-03-05 MED ORDER — ENOXAPARIN SODIUM 30 MG/0.3ML ~~LOC~~ SOLN
30.0000 mg | Freq: Two times a day (BID) | SUBCUTANEOUS | Status: DC
  Administered 2016-03-06 – 2016-03-08 (×5): 30 mg via SUBCUTANEOUS
  Filled 2016-03-05 (×6): qty 0.3

## 2016-03-05 MED ORDER — GLYCOPYRROLATE 0.2 MG/ML IJ SOLN
INTRAMUSCULAR | Status: DC | PRN
  Administered 2016-03-05: 0.2 mg via INTRAVENOUS

## 2016-03-05 MED ORDER — MORPHINE SULFATE 4 MG/ML IJ SOLN
INTRAMUSCULAR | Status: DC | PRN
  Administered 2016-03-05: 32 mL via INTRA_ARTICULAR

## 2016-03-05 MED ORDER — VITAMIN B-12 1000 MCG PO TABS
1000.0000 ug | ORAL_TABLET | Freq: Every day | ORAL | Status: DC
  Administered 2016-03-05 – 2016-03-08 (×4): 1000 ug via ORAL
  Filled 2016-03-05 (×4): qty 1

## 2016-03-05 MED ORDER — LISINOPRIL 10 MG PO TABS
10.0000 mg | ORAL_TABLET | Freq: Every day | ORAL | Status: DC
  Administered 2016-03-05: 10 mg via ORAL
  Filled 2016-03-05: qty 1

## 2016-03-05 MED ORDER — NEOMYCIN-POLYMYXIN B GU 40-200000 IR SOLN
Status: DC | PRN
  Administered 2016-03-05: 16 mL

## 2016-03-05 MED ORDER — METHOCARBAMOL 1000 MG/10ML IJ SOLN
500.0000 mg | Freq: Four times a day (QID) | INTRAVENOUS | Status: DC | PRN
  Filled 2016-03-05: qty 5

## 2016-03-05 MED ORDER — FENTANYL CITRATE (PF) 100 MCG/2ML IJ SOLN: 25.0000 ug | INTRAMUSCULAR | Status: DC | PRN

## 2016-03-05 MED ORDER — LISINOPRIL-HYDROCHLOROTHIAZIDE 10-12.5 MG PO TABS: 1.0000 | ORAL_TABLET | Freq: Every day | ORAL | Status: DC

## 2016-03-05 MED ORDER — MIDAZOLAM HCL 5 MG/5ML IJ SOLN
INTRAMUSCULAR | Status: DC | PRN
  Administered 2016-03-05: 1 mg via INTRAVENOUS
  Administered 2016-03-05: 2 mg via INTRAVENOUS

## 2016-03-05 MED ORDER — OMEGA-3-ACID ETHYL ESTERS 1 G PO CAPS
1.0000 | ORAL_CAPSULE | Freq: Every day | ORAL | Status: DC
  Administered 2016-03-05 – 2016-03-08 (×4): 1 g via ORAL
  Filled 2016-03-05 (×4): qty 1

## 2016-03-05 MED ORDER — CALCIUM CARBONATE 1500 (600 CA) MG PO TABS
600.0000 mg | ORAL_TABLET | Freq: Every day | ORAL | Status: DC
  Filled 2016-03-05: qty 1

## 2016-03-05 MED ORDER — ADULT MULTIVITAMIN W/MINERALS CH
1.0000 | ORAL_TABLET | Freq: Every day | ORAL | Status: DC
  Administered 2016-03-05 – 2016-03-08 (×4): 1 via ORAL
  Filled 2016-03-05 (×4): qty 1

## 2016-03-05 MED ORDER — ONDANSETRON HCL 4 MG/2ML IJ SOLN: 4.0000 mg | Freq: Four times a day (QID) | INTRAMUSCULAR | Status: DC | PRN

## 2016-03-05 MED ORDER — FAMOTIDINE 20 MG PO TABS
20.0000 mg | ORAL_TABLET | Freq: Once | ORAL | Status: AC
  Administered 2016-03-05: 20 mg via ORAL

## 2016-03-05 MED ORDER — BISACODYL 5 MG PO TBEC: 5.0000 mg | DELAYED_RELEASE_TABLET | Freq: Every day | ORAL | Status: DC | PRN

## 2016-03-05 MED ORDER — CEFAZOLIN SODIUM-DEXTROSE 2-4 GM/100ML-% IV SOLN
INTRAVENOUS | Status: AC
  Filled 2016-03-05: qty 100

## 2016-03-05 MED ORDER — CEFAZOLIN SODIUM-DEXTROSE 2-4 GM/100ML-% IV SOLN
2.0000 g | Freq: Four times a day (QID) | INTRAVENOUS | Status: AC
  Administered 2016-03-05 (×2): 2 g via INTRAVENOUS
  Filled 2016-03-05 (×2): qty 100

## 2016-03-05 MED ORDER — FAMOTIDINE 20 MG PO TABS
ORAL_TABLET | ORAL | Status: AC
  Administered 2016-03-05: 20 mg via ORAL
  Filled 2016-03-05: qty 1

## 2016-03-05 MED ORDER — VITAMIN D 1000 UNITS PO TABS
1000.0000 [IU] | ORAL_TABLET | Freq: Every day | ORAL | Status: DC
  Administered 2016-03-05 – 2016-03-08 (×4): 1000 [IU] via ORAL
  Filled 2016-03-05 (×4): qty 1

## 2016-03-05 MED ORDER — CELECOXIB 200 MG PO CAPS
200.0000 mg | ORAL_CAPSULE | Freq: Two times a day (BID) | ORAL | Status: DC
  Administered 2016-03-05 – 2016-03-07 (×6): 200 mg via ORAL
  Filled 2016-03-05 (×6): qty 1

## 2016-03-05 MED ORDER — MORPHINE SULFATE (PF) 4 MG/ML IV SOLN
INTRAVENOUS | Status: AC
  Filled 2016-03-05: qty 1

## 2016-03-05 MED ORDER — CLINDAMYCIN PHOSPHATE 600 MG/50ML IV SOLN
INTRAVENOUS | Status: AC
  Filled 2016-03-05: qty 50

## 2016-03-05 MED ORDER — PHENYLEPHRINE HCL 10 MG/ML IJ SOLN
INTRAMUSCULAR | Status: DC | PRN
  Administered 2016-03-05 (×6): 100 ug via INTRAVENOUS

## 2016-03-05 MED ORDER — ACETAMINOPHEN 325 MG PO TABS
650.0000 mg | ORAL_TABLET | Freq: Four times a day (QID) | ORAL | Status: DC | PRN
  Administered 2016-03-06 – 2016-03-08 (×4): 650 mg via ORAL
  Filled 2016-03-05 (×4): qty 2

## 2016-03-05 MED ORDER — BUPIVACAINE HCL (PF) 0.5 % IJ SOLN
INTRAMUSCULAR | Status: DC | PRN
  Administered 2016-03-05: 3 mL

## 2016-03-05 MED ORDER — OXYCODONE HCL 5 MG PO TABS: 5.0000 mg | ORAL_TABLET | Freq: Once | ORAL | Status: DC | PRN

## 2016-03-05 MED ORDER — GABAPENTIN 300 MG PO CAPS
300.0000 mg | ORAL_CAPSULE | Freq: Three times a day (TID) | ORAL | Status: DC
  Administered 2016-03-05 – 2016-03-07 (×6): 300 mg via ORAL
  Filled 2016-03-05 (×7): qty 1

## 2016-03-05 MED ORDER — FLEET ENEMA 7-19 GM/118ML RE ENEM: 1.0000 | ENEMA | Freq: Once | RECTAL | Status: DC | PRN

## 2016-03-05 MED ORDER — ONDANSETRON HCL 4 MG PO TABS: 4.0000 mg | ORAL_TABLET | Freq: Four times a day (QID) | ORAL | Status: DC | PRN

## 2016-03-05 MED ORDER — BUPIVACAINE-EPINEPHRINE (PF) 0.25% -1:200000 IJ SOLN
INTRAMUSCULAR | Status: AC
  Filled 2016-03-05: qty 30

## 2016-03-05 MED ORDER — DOCUSATE SODIUM 100 MG PO CAPS
100.0000 mg | ORAL_CAPSULE | Freq: Two times a day (BID) | ORAL | Status: DC
  Administered 2016-03-05 – 2016-03-08 (×6): 100 mg via ORAL
  Filled 2016-03-05 (×7): qty 1

## 2016-03-05 MED ORDER — SODIUM CHLORIDE 0.9 % IV SOLN
INTRAVENOUS | Status: DC
  Administered 2016-03-05: 13:00:00 via INTRAVENOUS

## 2016-03-05 MED ORDER — HYDROCHLOROTHIAZIDE 12.5 MG PO CAPS
12.5000 mg | ORAL_CAPSULE | Freq: Every day | ORAL | Status: DC
  Administered 2016-03-05: 12.5 mg via ORAL
  Filled 2016-03-05: qty 1

## 2016-03-05 MED ORDER — OXYCODONE HCL 5 MG/5ML PO SOLN: 5.0000 mg | Freq: Once | ORAL | Status: DC | PRN

## 2016-03-05 MED ORDER — MENTHOL 3 MG MT LOZG
1.0000 | LOZENGE | OROMUCOSAL | Status: DC | PRN
  Filled 2016-03-05: qty 9

## 2016-03-05 MED ORDER — CALCIUM CARBONATE ANTACID 500 MG PO CHEW
1500.0000 mg | CHEWABLE_TABLET | Freq: Every day | ORAL | Status: DC
  Filled 2016-03-05 (×2): qty 3

## 2016-03-05 MED ORDER — MAGNESIUM OXIDE 400 (241.3 MG) MG PO TABS
400.0000 mg | ORAL_TABLET | Freq: Every day | ORAL | Status: DC
  Administered 2016-03-05 – 2016-03-07 (×3): 400 mg via ORAL
  Filled 2016-03-05 (×3): qty 1

## 2016-03-05 SURGICAL SUPPLY — 72 items
AUTOTRANSFUS HAS 1/8 (MISCELLANEOUS) ×3
BAG DECANTER FOR FLEXI CONT (MISCELLANEOUS) ×3 IMPLANT
BLADE DEBAKEY 8.0 (BLADE) ×2 IMPLANT
BLADE DEBAKEY 8.0MM (BLADE) ×1
BLADE SAW 1 (BLADE) ×3 IMPLANT
BLADE SAW 1/2 (BLADE) ×3 IMPLANT
BLADE SURG 15 STRL LF DISP TIS (BLADE) ×1 IMPLANT
BLADE SURG 15 STRL SS (BLADE) ×2
BRACE KNEE POST OP SHORT (BRACE) ×3 IMPLANT
CANISTER SUCT 1200ML W/VALVE (MISCELLANEOUS) ×6 IMPLANT
CAP KNEE TOTAL 3 SIGMA ×3 IMPLANT
CATH TRAY METER 16FR LF (MISCELLANEOUS) ×3 IMPLANT
CEMENT HV SMART SET (Cement) ×6 IMPLANT
CNTNR SPEC 2.5X3XGRAD LEK (MISCELLANEOUS)
CONT SPEC 4OZ STER OR WHT (MISCELLANEOUS)
CONTAINER SPEC 2.5X3XGRAD LEK (MISCELLANEOUS) IMPLANT
COOLER POLAR GLACIER W/PUMP (MISCELLANEOUS) ×3 IMPLANT
CUFF TOURN 24 STER (MISCELLANEOUS) IMPLANT
CUFF TOURN 30 STER DUAL PORT (MISCELLANEOUS) ×3 IMPLANT
DRAPE IMP U-DRAPE 54X76 (DRAPES) ×3 IMPLANT
DRAPE INCISE IOBAN 66X60 STRL (DRAPES) ×3 IMPLANT
DRAPE SHEET LG 3/4 BI-LAMINATE (DRAPES) ×6 IMPLANT
DRAPE SURG 17X11 SM STRL (DRAPES) IMPLANT
DRSG OPSITE POSTOP 4X12 (GAUZE/BANDAGES/DRESSINGS) ×3 IMPLANT
DRSG OPSITE POSTOP 4X14 (GAUZE/BANDAGES/DRESSINGS) IMPLANT
DURAPREP 26ML APPLICATOR (WOUND CARE) ×9 IMPLANT
ELECT REM PT RETURN 9FT ADLT (ELECTROSURGICAL) ×3
ELECTRODE REM PT RTRN 9FT ADLT (ELECTROSURGICAL) ×1 IMPLANT
GAUZE PETRO XEROFOAM 1X8 (MISCELLANEOUS) ×3 IMPLANT
GAUZE SPONGE 4X4 12PLY STRL (GAUZE/BANDAGES/DRESSINGS) ×3 IMPLANT
GLOVE BIO SURGEON STRL SZ 6.5 (GLOVE) ×4 IMPLANT
GLOVE BIO SURGEON STRL SZ7 (GLOVE) ×9 IMPLANT
GLOVE BIO SURGEONS STRL SZ 6.5 (GLOVE) ×2
GLOVE BIOGEL PI IND STRL 9 (GLOVE) ×1 IMPLANT
GLOVE BIOGEL PI INDICATOR 9 (GLOVE) ×2
GLOVE INDICATOR 7.0 STRL GRN (GLOVE) ×6 IMPLANT
GLOVE SURG 9.0 ORTHO LTXF (GLOVE) ×9 IMPLANT
GOWN STRL REUS TWL 2XL XL LVL4 (GOWN DISPOSABLE) ×3 IMPLANT
GOWN STRL REUS W/ TWL LRG LVL3 (GOWN DISPOSABLE) ×4 IMPLANT
GOWN STRL REUS W/TWL LRG LVL3 (GOWN DISPOSABLE) ×8
GOWN STRL REUS W/TWL XL LVL4 (GOWN DISPOSABLE) ×3 IMPLANT
HANDPIECE INTERPULSE COAX TIP (DISPOSABLE) ×2
IMMBOLIZER KNEE 19 BLUE UNIV (SOFTGOODS) IMPLANT
IV NS 100ML SINGLE PACK (IV SOLUTION) ×3 IMPLANT
KIT RM TURNOVER STRD PROC AR (KITS) ×3 IMPLANT
NDL SAFETY 18GX1.5 (NEEDLE) ×3 IMPLANT
NDL SAFETY 22GX1.5 (NEEDLE) ×3 IMPLANT
NDL SAFETY ECLIPSE 18X1.5 (NEEDLE) ×1 IMPLANT
NEEDLE HYPO 18GX1.5 SHARP (NEEDLE) ×2
NEEDLE SPNL 20GX3.5 QUINCKE YW (NEEDLE) ×3 IMPLANT
NS IRRIG 1000ML POUR BTL (IV SOLUTION) ×3 IMPLANT
PACK TOTAL KNEE (MISCELLANEOUS) ×3 IMPLANT
PAD PREP 24X41 OB/GYN DISP (PERSONAL CARE ITEMS) ×3 IMPLANT
PAD WRAPON POLAR KNEE (MISCELLANEOUS) ×1 IMPLANT
SET HNDPC FAN SPRY TIP SCT (DISPOSABLE) ×1 IMPLANT
SOL .9 NS 3000ML IRR  AL (IV SOLUTION) ×2
SOL .9 NS 3000ML IRR UROMATIC (IV SOLUTION) ×1 IMPLANT
SPONGE DRAIN TRACH 4X4 STRL 2S (GAUZE/BANDAGES/DRESSINGS) ×3 IMPLANT
SPONGE LAP 18X18 5 PK (GAUZE/BANDAGES/DRESSINGS) ×3 IMPLANT
STAPLER SKIN PROX 35W (STAPLE) ×3 IMPLANT
SUCTION FRAZIER HANDLE 10FR (MISCELLANEOUS) ×2
SUCTION TUBE FRAZIER 10FR DISP (MISCELLANEOUS) ×1 IMPLANT
SUT ETHIBOND NAB CT1 #1 30IN (SUTURE) ×6 IMPLANT
SUT MNCRL AB 3-0 PS2 27 (SUTURE) ×6 IMPLANT
SUT VIC AB 0 CT1 36 (SUTURE) ×3 IMPLANT
SUT VIC AB 2-0 CT1 (SUTURE) ×6 IMPLANT
SYR 20CC LL (SYRINGE) ×6 IMPLANT
SYR 50ML LL SCALE MARK (SYRINGE) ×6 IMPLANT
SYSTEM AUTOTRANSFUS DUAL TROCR (MISCELLANEOUS) ×1 IMPLANT
TOWER CARTRIDGE SMART MIX (DISPOSABLE) ×3 IMPLANT
TUBE SUCT KAM VAC (TUBING) ×3 IMPLANT
WRAPON POLAR PAD KNEE (MISCELLANEOUS) ×3

## 2016-03-05 NOTE — Anesthesia Procedure Notes (Signed)
Spinal  Patient location during procedure: OR Start time: 03/05/2016 7:35 AM End time: 03/05/2016 7:45 AM Staffing Resident/CRNA: Nelda Marseille Performed: resident/CRNA  Preanesthetic Checklist Completed: patient identified, site marked, surgical consent, pre-op evaluation, timeout performed, IV checked, risks and benefits discussed and monitors and equipment checked Spinal Block Patient position: sitting Prep: Betadine Patient monitoring: heart rate, continuous pulse ox, blood pressure and cardiac monitor Approach: midline Location: L4-5 Injection technique: single-shot Needle Needle type: Whitacre and Introducer  Needle gauge: 24 G Needle length: 9 cm Assessment Sensory level: T10 Additional Notes Negative paresthesia. Negative blood return. Positive free-flowing CSF. Expiration date of kit checked and confirmed. Patient tolerated procedure well, without complications.

## 2016-03-05 NOTE — Care Management Note (Signed)
Case Management Note  Patient Details  Name: Maria Faulkner MRN: [id number] Date of Birth: 1941/05/29  Subjective/Objective:   POD left TKA. Met with patient at bedside. She lives at home with her spouse. Discussed discharge plan. Patient prefers to go to [place] if possible. If she has to have HHPT, she prefers [facility]. Uses [facility]  [phone]. She has a walker.              Action/Plan: PT consult pending  Expected Discharge Date:                  Expected Discharge Plan:  [place]  In-House Referral:     Discharge planning Services  CM Consult  Post Acute Care Choice:  Home Health Choice offered to:  Patient  DME Arranged:    DME Agency:     HH Arranged:    [place] Agency:  [place]  Status of Service:  In process, will continue to follow  If discussed at Long Length of Stay Meetings, dates discussed:    Additional Comments:  Jolly Mango, RN 03/05/2016, 2:33 PM

## 2016-03-05 NOTE — NC FL2 (Signed)
[place] MEDICAID FL2 LEVEL OF CARE SCREENING TOOL     IDENTIFICATION  Patient Name: Maria ShutterVirginia B Eley Birthdate: March 06, 1941 Sex: female Admission Date (Current Location): 03/05/2016  [place]   Facility and Address:  [facility], [address], [place], [place] [address]      Provider Number: [id number]  Attending Physician Name and Address:  Juanell FairlyKevin Krasinski, MD  Relative Name and Phone Number:       Current Level of Care: Hospital Recommended Level of Care: Skilled Nursing Facility Prior Approval Number:    Date Approved/Denied:   PASRR Number:  [id number])  Discharge Plan: SNF    Current Diagnoses: Patient Active Problem List   Diagnosis Date Noted  . S/P total knee arthroplasty 03/05/2016  . History of total right knee replacement 07/10/2015    Orientation RESPIRATION BLADDER Height & Weight     Self, Time, Situation, Place  Normal Incontinent (Urinary Catheter.) Weight: 200 lb (90.7 kg) Height:  5\' 2"  (157.5 cm)  BEHAVIORAL SYMPTOMS/MOOD NEUROLOGICAL BOWEL NUTRITION STATUS   (none.)  (none. ) Continent Diet (Regular Diet. )  AMBULATORY STATUS COMMUNICATION OF NEEDS Skin   Extensive Assist Verbally Surgical wounds (Incision: Left Knee )                       Personal Care Assistance Level of Assistance  Bathing, Feeding, Dressing Bathing Assistance: Limited assistance Feeding assistance: Independent Dressing Assistance: Limited assistance     Functional Limitations Info  Sight, Speech, Hearing Sight Info: Adequate Hearing Info: Adequate Speech Info: Adequate    SPECIAL CARE FACTORS FREQUENCY  PT (By licensed PT), OT (By licensed OT)     PT Frequency:  (5) OT Frequency:  (5)            Contractures      Additional Factors Info  Code Status, Allergies Code Status Info:  (Full Code.) Allergies Info:  (No Known Allergies )           Current Medications (03/05/2016):  This  is the current hospital active medication list Current Facility-Administered Medications  Medication Dose Route Frequency Provider Last Rate Last Dose  . 0.9 %  sodium chloride infusion   Intravenous Continuous Juanell FairlyKevin Krasinski, MD 75 mL/hr at 03/05/16 1318    . acetaminophen (TYLENOL) tablet 650 mg  650 mg Oral Q6H PRN Juanell FairlyKevin Krasinski, MD       Or  . acetaminophen (TYLENOL) suppository 650 mg  650 mg Rectal Q6H PRN Juanell FairlyKevin Krasinski, MD      . alum & mag hydroxide-simeth (MAALOX/MYLANTA) 200-200-20 MG/5ML suspension 30 mL  30 mL Oral Q4H PRN Juanell FairlyKevin Krasinski, MD      . aspirin EC tablet 81 mg  81 mg Oral Daily Juanell FairlyKevin Krasinski, MD      . atorvastatin (LIPITOR) tablet 10 mg  10 mg Oral Daily Juanell FairlyKevin Krasinski, MD      . bisacodyl (DULCOLAX) EC tablet 5 mg  5 mg Oral Daily PRN Juanell FairlyKevin Krasinski, MD      . calcium carbonate (TUMS - dosed in mg elemental calcium) chewable tablet 1,500 mg  1,500 mg Oral Daily Juanell FairlyKevin Krasinski, MD      . ceFAZolin (ANCEF) IVPB 2g/100 mL premix  2 g Intravenous Q6H Juanell FairlyKevin Krasinski, MD   2 g at 03/05/16 1319  . celecoxib (CELEBREX) capsule 200 mg  200 mg Oral Q12H Juanell FairlyKevin Krasinski, MD      . cholecalciferol (VITAMIN D) tablet  1,000 Units  1,000 Units Oral Daily Juanell Fairly, MD      . docusate sodium (COLACE) capsule 100 mg  100 mg Oral BID Juanell Fairly, MD      . Melene Muller ON 03/06/2016] enoxaparin (LOVENOX) injection 30 mg  30 mg Subcutaneous Q12H Juanell Fairly, MD      . gabapentin (NEURONTIN) capsule 300 mg  300 mg Oral TID Juanell Fairly, MD      . lisinopril (PRINIVIL,ZESTRIL) tablet 10 mg  10 mg Oral Daily Juanell Fairly, MD       And  . hydrochlorothiazide (MICROZIDE) capsule 12.5 mg  12.5 mg Oral Daily Juanell Fairly, MD      . HYDROmorphone (DILAUDID) injection 1 mg  1 mg Intravenous Q2H PRN Juanell Fairly, MD      . Melene Muller ON 03/06/2016] Influenza vac split quadrivalent PF (FLUARIX) injection 0.5 mL  0.5 mL Intramuscular Tomorrow-1000 Juanell Fairly, MD       . magnesium hydroxide (MILK OF MAGNESIA) suspension 30 mL  30 mL Oral Daily PRN Juanell Fairly, MD      . magnesium oxide (MAG-OX) tablet 400 mg  400 mg Oral QHS Juanell Fairly, MD      . menthol-cetylpyridinium (CEPACOL) lozenge 3 mg  1 lozenge Oral PRN Juanell Fairly, MD       Or  . phenol (CHLORASEPTIC) mouth spray 1 spray  1 spray Mouth/Throat PRN Juanell Fairly, MD      . methocarbamol (ROBAXIN) tablet 500 mg  500 mg Oral Q6H PRN Juanell Fairly, MD       Or  . methocarbamol (ROBAXIN) 500 mg in dextrose 5 % 50 mL IVPB  500 mg Intravenous Q6H PRN Juanell Fairly, MD      . multivitamin with minerals tablet 1 tablet  1 tablet Oral Daily Juanell Fairly, MD      . omega-3 acid ethyl esters (LOVAZA) capsule 1 g  1 capsule Oral Daily Juanell Fairly, MD      . ondansetron [facility]) tablet 4 mg  4 mg Oral Q6H PRN Juanell Fairly, MD       Or  . ondansetron [facility]) injection 4 mg  4 mg Intravenous Q6H PRN Juanell Fairly, MD      . oxyCODONE (Oxy IR/ROXICODONE) immediate release tablet 10-15 mg  10-15 mg Oral Q3H PRN Juanell Fairly, MD   15 mg at 03/05/16 1317  . potassium chloride (KLOR-CON) CR tablet 8 mEq  8 mEq Oral Daily Juanell Fairly, MD      . sodium phosphate (FLEET) 7-19 GM/118ML enema 1 enema  1 enema Rectal Once PRN Juanell Fairly, MD      . vitamin B-12 (CYANOCOBALAMIN) tablet 1,000 mcg  1,000 mcg Oral Daily Juanell Fairly, MD      . vitamin E capsule 400 Units  400 Units Oral Daily Juanell Fairly, MD         Discharge Medications: Please see discharge summary for a list of discharge medications.  Relevant Imaging Results:  Relevant Lab Results:   Additional Information  (SSN: [id number])  Ralene Bathe, Student-Social Work

## 2016-03-05 NOTE — Anesthesia Procedure Notes (Signed)
Date/Time: 03/05/2016 7:45 AM Performed by: Junious SilkNOLES, Tensley Wery Pre-anesthesia Checklist: Patient identified, Emergency Drugs available, Suction available, Patient being monitored and Timeout performed Oxygen Delivery Method: Simple face mask

## 2016-03-05 NOTE — H&P (Signed)
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 Symptoms of left knee pain and instability are rated as moderate to severe, and have been worsening.  This is significantly impairing activities of daily living.  She has elected for surgical management.   Past Medical History:  Diagnosis Date  . Anxiety   . Arthritis   . Hypercholesteremia   . Hypertension    Past Surgical History:  Procedure Laterality Date  . ABDOMINAL HYSTERECTOMY    . BREAST CYST ASPIRATION  06/15/1988   approximate date, unsure of which breast  . TOTAL KNEE ARTHROPLASTY Right 07/10/2015   Procedure: TOTAL KNEE ARTHROPLASTY;  Surgeon: Juanell FairlyKevin Kolbie Clarkston, MD;  Location: [facility] ORS;  Service: Orthopedics;  Laterality: Right;  . TOTAL KNEE ARTHROPLASTY Right    Social History   Social History  . Marital status: Married    Spouse name: N/A  . Number of children: N/A  . Years of education: N/A   Social History Main Topics  . Smoking status: Former Smoker    Quit date: 06/25/1994  . Smokeless tobacco: Never Used  . Alcohol use No  . Drug use: No  . Sexual activity: Not Asked   Other Topics Concern  . None   Social History Narrative  . None   History reviewed. No pertinent family history. No Known Allergies Prior to Admission medications   Medication Sig Start Date End Date Taking? Authorizing Provider  acetaminophen (TYLENOL) 650 MG CR tablet Take 650 mg by mouth every 8 (eight) hours as needed for pain.   Yes Historical Provider, MD  aspirin 81 MG tablet Take 81 mg by mouth every morning.   Yes Historical Provider, MD  atorvastatin (LIPITOR) 10 MG tablet Take 10 mg by mouth every morning.   Yes Historical Provider, MD  Calcium Carbonate (CALCIUM 600 PO) Take 1 capsule by mouth daily.   Yes Historical  Provider, MD  Cholecalciferol (VITAMIN D3) 10000 units TABS Take 1 capsule by mouth daily.   Yes Historical Provider, MD  lisinopril-hydrochlorothiazide (PRINZIDE,ZESTORETIC) 10-12.5 MG tablet Take 1 tablet by mouth daily.   Yes Historical Provider, MD  Magnesium 500 MG CAPS Take 500 mg by mouth at bedtime.   Yes Historical Provider, MD  meloxicam (MOBIC) 7.5 MG tablet Take 7.5 mg by mouth daily.   Yes Historical Provider, MD  Multiple Vitamin (MULTIVITAMIN) tablet Take 1 tablet by mouth every morning.   Yes Historical Provider, MD  Omega-3 Fatty Acids ([employer] FISH OIL) 1200 MG CAPS Take 1 capsule by mouth daily.   Yes Historical Provider, MD  POTASSIUM CHLORIDE ER PO Take 8 mEq by mouth every morning.   Yes Historical Provider, MD  vitamin B-12 (CYANOCOBALAMIN) 1000 MCG tablet Take 1,000 mcg by mouth daily.   Yes Historical Provider, MD  vitamin E (VITAMIN E) 400 UNIT capsule Take 400 Units by mouth daily.   Yes Historical Provider, MD     Positive ROS: All other systems have been reviewed and were otherwise negative with the exception of those mentioned in the HPI and as above.  Physical Exam: General: Alert, no acute distress Cardiovascular: Regular rate and rhythm, no murmurs rubs or gallops.  No pedal edema Respiratory: Clear to auscultation bilaterally, no wheezes rales or rhonchi. No cyanosis, no use of accessory musculature GI: No organomegaly, abdomen is soft and non-tender  nondistended with positive bowel sounds. Skin: Skin intact, no lesions within the operative field. Neurologic: Sensation intact distally Psychiatric: Patient is competent for consent with normal mood and affect Lymphatic: No cervical lymphadenopathy  MUSCULOSKELETAL: Left knee: Patient's skin is intact. There is no erythema or ecchymosis or detectable effusion. Knee range of motion is from -5-110. Patient had no treatment is laxity. She has tenderness over the medial joint line. Patient has patellofemoral crepitus  and grind but no apprehension. She has no calf tenderness or lower leg edema. She has intact sensation light touch, palpable pedal pulses and intact motor function distally.  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 She is aware the risks benefits of this procedure. She is also aware of the details of the operation as well as the postoperative course.  I have reviewed with the patient and her husband the risks and benefits of surgery. They understand the risks include but are not limited to infection, bleeding requiring blood transfusion, nerve or blood vessel injury, joint stiffness or loss of motion, persistent pain, weakness or instability, malunion, nonunion and hardware failure and the need for further surgery. Medical risks include but are not limited to DVT and pulmonary embolism, myocardial infarction, stroke, pneumonia, respiratory failure and death. Patient understood these risks and wished to proceed.   Juanell Fairly, MD   03/05/2016 7:28 AM

## 2016-03-05 NOTE — Transfer of Care (Signed)
Immediate Anesthesia Transfer of Care Note  Patient: Maria Faulkner  Procedure(s) Performed: Procedure(s): TOTAL KNEE ARTHROPLASTY (Left)  Patient Location: PACU  Anesthesia Type:Spinal  Level of Consciousness: awake, alert  and oriented  Airway & Oxygen Therapy: Patient Spontanous Breathing and Patient connected to face mask oxygen  Post-op Assessment: Report given to RN and Post -op Vital signs reviewed and stable  Post vital signs: Reviewed and stable  Last Vitals:  Vitals:   03/05/16 0614  BP: (!) 141/57  Resp: 17  Temp: 36.4 C    Last Pain:  Vitals:   03/05/16 0614  TempSrc: Tympanic  PainSc: 2       Patients Stated Pain Goal: 0 (03/05/16 [id number])  Complications: No apparent anesthesia complications

## 2016-03-05 NOTE — Evaluation (Signed)
Physical Therapy Evaluation Patient Details Name: Maria Faulkner MRN: [id number] DOB: [birth year] Today's Date: 03/05/2016   History of Present Illness  admitted for acute hospitalization status post L TKR (03/05/16), WBAT; per order patient with possible MCL strain, to wear hinged-knee brace locked to extension but may unlock for 0-90 with therapy  Clinical Impression  Patient alert and oriented; follows all commands and demonstrates excellent effort/participation with session.  Fair/good post-op strength and ROM to L knee (0-65 degrees, 3-/5) with good isolated activation of L quad noted.  Able to complete bed mobility, sit/stand, basic transfers and short-distance gait (5') with RW, min assist.  Limited tolerance for stance time/weight acceptance to L LE, but no noted buckling or LOB.  Will continue to progress mobility as pain allows.  Will need education on donning/doffing L KI. Would benefit from skilled PT to address above deficits and promote optimal return to PLOF; recommend transition to STR upon discharge from acute hospitalization.     Follow Up Recommendations SNF (will monitor and update per progress and ability to manage L KI)    Equipment Recommendations       Recommendations for Other Services       Precautions / Restrictions Precautions Precautions: Fall Restrictions Weight Bearing Restrictions: Yes LLE Weight Bearing: Weight bearing as tolerated Other Position/Activity Restrictions: hinged knee brace to L LE, locked to extension outside of therapy; may unlock 0-90 during therapy      Mobility  Bed Mobility Overal bed mobility: Needs Assistance Bed Mobility: Supine to Sit     Supine to sit: Min assist     General bed mobility comments: for management of L LE over edge of bed  Transfers Overall transfer level: Needs assistance Equipment used: Rolling walker (2 wheeled) Transfers: Sit to/from Stand Sit to Stand: Min guard;Min assist         General  transfer comment: heavy weight shift to R LE with movement transition  Ambulation/Gait Ambulation/Gait assistance: Min assist Ambulation Distance (Feet): 5 Feet Assistive device: Rolling walker (2 wheeled)       General Gait Details: step to gait pattern with heavy WBing bilat UEs; limited stance time/weight acceptance to L LE.  Additional distance limited by pain  Stairs            Wheelchair Mobility    Modified Rankin (Stroke Patients Only)       Balance Overall balance assessment: Needs assistance Sitting-balance support: No upper extremity supported;Feet supported Sitting balance-Leahy Scale: Good     Standing balance support: Bilateral upper extremity supported Standing balance-Leahy Scale: Fair                               Pertinent Vitals/Pain Pain Assessment: 0-10 Pain Score: 7  Pain Location: L knee Pain Descriptors / Indicators: Aching;Burning Pain Intervention(s): Limited activity within patient's tolerance;Monitored during session;Repositioned    Home Living Family/patient expects to be discharged to:: Private residence Living Arrangements: Spouse/significant other Available Help at Discharge: Family Type of Home: House Home Access: Stairs to enter Entrance Stairs-Rails: None Entrance Stairs-Number of Steps: 1 Home Layout: One level Home Equipment: Environmental consultant - 2 wheels;Cane - quad;Grab bars - tub/shower      Prior Function Level of Independence: Independent with assistive device(s)         Comments: Indep for ADLs, household and community mobilization; recent use of RW due to worsening of L knee     Hand Dominance  Dominant Hand: Right    Extremity/Trunk Assessment   Upper Extremity Assessment: Overall WFL for tasks assessed           Lower Extremity Assessment: Generalized weakness (grossly at least 4+/5 throughout, except L knee 3-/5 due to post-op guarding and weakness; sensation fully intact)          Communication   Communication: No difficulties  Cognition Arousal/Alertness: Awake/alert Behavior During Therapy: WFL for tasks assessed/performed Overall Cognitive Status: Within Functional Limits for tasks assessed                      General Comments      Exercises Total Joint Exercises Goniometric ROM: 0-65 Other Exercises Other Exercises: Supine LE therex, 1x10, AROM for muscular strength/endurance: ankle pumps, quad sets, hip abduct/adduct, SLR and LAQs.  Fair/good L quad activation and control with isolated therex.   Assessment/Plan    PT Assessment Patient needs continued PT services  PT Problem List Decreased strength;Decreased range of motion;Decreased activity tolerance;Decreased balance;Decreased mobility;Decreased knowledge of use of DME;Decreased safety awareness;Decreased knowledge of precautions;Decreased skin integrity;Pain          PT Treatment Interventions DME instruction;Gait training;Stair training;Functional mobility training;Therapeutic activities;Therapeutic exercise;Patient/family education    PT Goals (Current goals can be found in the Care Plan section)  Acute Rehab PT Goals Patient Stated Goal: "to go to rehab this time" PT Goal Formulation: With patient Time For Goal Achievement: 03/19/16 Potential to Achieve Goals: Good    Frequency BID   Barriers to discharge        Co-evaluation               End of Session Equipment Utilized During Treatment: Gait belt Activity Tolerance: Patient tolerated treatment well Patient left: in chair;with call bell/phone within reach;with chair alarm set Nurse Communication: Patient requests pain meds         Time: 1530-1555 PT Time Calculation (min) (ACUTE ONLY): 25 min   Charges:   PT Evaluation $PT Eval Low Complexity: 1 Procedure PT Treatments $Therapeutic Exercise: 8-22 mins   PT G Codes:        Maria Faulkner, PT, DPT, NCS 03/05/16, 5:10 PM [phone]

## 2016-03-05 NOTE — Anesthesia Preprocedure Evaluation (Signed)
Anesthesia Evaluation  Patient identified by MRN, date of birth, ID band Patient awake    Reviewed: Allergy & Precautions, H&P , NPO status , Patient's Chart, lab work & pertinent test results, reviewed documented beta blocker date and time   History of Anesthesia Complications Negative for: history of anesthetic complications  Airway Mallampati: III  TM Distance: <3 FB Neck ROM: limited    Dental no notable dental hx. (+) Poor Dentition, Chipped, Missing, Partial Upper, Partial Lower   Pulmonary neg shortness of breath, former smoker,    Pulmonary exam normal breath sounds clear to auscultation       Cardiovascular Exercise Tolerance: Good hypertension, (-) angina(-) Past MI and (-) DOE Normal cardiovascular exam Rhythm:regular Rate:Normal     Neuro/Psych PSYCHIATRIC DISORDERS Anxiety negative neurological ROS     GI/Hepatic negative GI ROS, Neg liver ROS, neg GERD  ,  Endo/Other  negative endocrine ROS  Renal/GU negative Renal ROS  negative genitourinary   Musculoskeletal  (+) Arthritis ,   Abdominal   Peds  Hematology negative hematology ROS (+)   Anesthesia Other Findings Past Medical History: No date: Anxiety No date: Arthritis No date: Hypercholesteremia No date: Hypertension   Reproductive/Obstetrics negative OB ROS                             Anesthesia Physical Anesthesia Plan  ASA: III  Anesthesia Plan: Spinal   Post-op Pain Management:    Induction:   Airway Management Planned:   Additional Equipment:   Intra-op Plan:   Post-operative Plan:   Informed Consent: I have reviewed the patients History and Physical, chart, labs and discussed the procedure including the risks, benefits and alternatives for the proposed anesthesia with the patient or authorized representative who has indicated his/her understanding and acceptance.     Plan Discussed with: CRNA,  Anesthesiologist and Surgeon  Anesthesia Plan Comments:         Anesthesia Quick Evaluation

## 2016-03-05 NOTE — Op Note (Addendum)
DATE OF SURGERY:  03/05/2016 TIME: 10:55 AM  PATIENT NAME:  Maria Faulkner   AGE: 75 y.o.    PRE-OPERATIVE DIAGNOSIS:  Tricompartmental arthritis arthritis left knee  POST-OPERATIVE DIAGNOSIS:  Same  PROCEDURE:  Procedure(s): TOTAL KNEE ARTHROPLASTY  SURGEON:  Juanell FairlyKRASINSKI, Leonela Kivi, MD   ANESTHESIA:  SPINAL WITH LOCAL (Marcaine, Toradol, Morphine, Exparel)   ASSISTANT:  Hurshel KeysAlex Meloy, PA  OPERATIVE IMPLANTS: Depuy PFC Sigma, Posterior Stabilized Femural component size 2.5, Tibia size rotating platform component size 3, Patella polyethylene 3-peg oval button size 38, with a 10 mm polyethylene insert.   PREOPERATIVE INDICATIONS:  Maria Faulkner is an 75 y.o. female who has a diagnosis of left knee tricompartmental osteoarthritis and elected for a left total knee arthroplasty with significant impairment of their activities of daily living.  Radiographs have demonstrated tricompartmental osteoarthritis joint space narrowing, osteophytes, subchondral sclerosis and cyst formation. Patient has undergone a successful right total knee arthroplasty by me in the past.  The risks, benefits, and alternatives were discussed at length including but not limited to the risks of infection, bleeding, nerve or blood vessel injury, knee stiffness, fracture, dislocation, loosening or failure of the hardware and the need for further surgery. Medical risks include but not limited to DVT and pulmonary embolism, myocardial infarction, stroke, pneumonia, respiratory failure and death. I discussed these risks with the patient in my office prior to the date of surgery. They understood these risks and were willing to proceed.  OPERATIVE FINDINGS AND UNIQUE ASPECTS OF THE CASE:  Tricompartmental osteoarthritis left knee  OPERATIVE DESCRIPTION:  The patient was brought to the operative room and placed in a supine position after undergoing placement of a spinal anesthetic.  A Foley catheter was placed.  IV antibiotics  were given. Patient received Ancef and clindamycin. The lower extremity was prepped and draped in the usual sterile fashion.  A time out was performed to verify the patient's name, date of birth, medical record number, correct site of surgery and correct procedure to be performed. The timeout was also used to confirm the patient received antibiotics and that appropriate instruments, implants and radiographs studies were available in the room.  The leg was elevated and exsanguinated with an Esmarch and the tourniquet was inflated to 275 mmHg for 125 minutes..  A midline incision was made over the left knee. Full-thickness skin flaps were developed. A medial parapatellar arthrotomy was then made and the patella everted and the knee was brought into 90 of flexion. Hoffa's fat pad along with the cruciate ligaments and medial and lateral menisci were resected.   The distal femoral intramedullary canal was opened with a drill and the intramedullary distal femoral cutting jig was inserted into the femoral canal pinned into position. It was set at 5 degrees resecting 10 mm off the distal femur.  Care was taken to protect the collateral ligaments during distal femoral resection.  The distal femoral resection was performed with an oscillating saw. The femoral cutting guide was then removed.  The extramedullary tibial cutting guide was then placed using the anterior tibial crest and second ray of the foot as a references.  The tibial cutting guide was adjusted to allow for appropriate posterior slope.  The tibial cutting block was pinned into position. The slotted stylus was used to measure the proximal tibial resection of 10 mm off the high lateral side.  The tibial long rod alignment guide was then used to confirm position of the cutting block. A third cross pin through  the tibial cutting block was then drilled into position to allow for rotational stability. Care was taken during the tibial resection to protect the  medial and collateral ligaments.  The resected tibial bone was removed along with the posterior horns of the menisci.  The PCL was sacrificed.  Extension gap was measured with a spacer block and alignment and extension was confirmed using a long alignment rod.  The attention was then turned back to the femur. The posterior referencing distal femoral sizing guide was applied to the distal femur.  The femur was sized to be a 2.5. Rotation of the referencing guide was checked with the epicondylar axis and Whitesides line. Then the 4-in-1 cutting jig was then applied to the distal femur. A stylus was used to confirm that the anterior femur would not be notched.   Then the anterior, posterior and chamfer femoral cuts were then made with an oscillating saw.  All posterior osteophytes were removed.  The flexion gap was then measured with a flexion spacer block and long alignment rod and was found to be symmetric with the extension gap and perpendicular to mechanical axis of the tibia.  The distal femoral preparation was completed by performing the posterior stabilized box cut using the cutting block. The entry site for the intramedullary femoral guide was filled with autologous bone graft from bone previously resected earlier in the case.  The proximal tibia plateau was then sized with trial trays. The best coverage was achieved with a size 3. This tibial tray was then pinned into position. The proximal tibia was then prepared with the reamer and keel punch.  After tibial preparation was completed, all trial components were inserted with polyethylene trials.  The knee was found to have excellent balance and full motion with a size 10 mm tibial polyethylene insert.  Patient noted to have mild laxity medially in mid flexion possibly due to subluxation of the MCL posteriorly. Patient had no instability and excellent balance and full extension.    The attention was then turned to preparation of the patella. The  thickness of the patella was measured with a caliper, the diameter measured with the patella templates.  The patella resection was then made with an oscillating saw using the patella cutting guide.  3 peg holes for the patella component were then drilled. The trial patella was then placed. Knee was taken through a full range of motion and deemed to be stable with the trial components. All trial components were then removed. The knee capsule was then injected with Exparel as well as an injection of solution of morphine, Marcaine and Toradol.  The joint was copiously irrigated with pulse lavage.  The final total knee arthroplasty components were then cemented into place with a 10 mm trial polyethylene insert and all excess methylmethacrylate was removed.  The joint was again copiously irrigated. After the cement had hardened the knee was again taken through a full range of motion. It was felt to be most stable with the 10 mm tibial polyethylene insert. The actual tibial polyethylene insert was then placed.   The knee was taken through a range of motion and the patella tracked well and the knee was again irrigated copiously.    An Autovac drain was placed. The 2 drain limbs were brought out through the superior lateral knee. The medial arthrotomy was closed with #1 Ethibond. The subcutaneous tissue closed with 0 and 2-0 vicryl, and skin approximated with staples.  A dry sterile and compressive dressing  was applied.  A Polar Care was applied to the operative site.  Patient was placed in a hinged knee brace allowing for 0-90 of flexion due to the possibility of MCL sprain.  The patient was awakened and brought to the PACU in stable and satisfactory condition.  All sharp, lap and instrument counts were correct at the conclusion the case. I spoke with the patient's husband in the postop consultation room to let him know the case had been performed successfully and the patient was stable in recovery room.   Total  tourniquet time was 125 minutes.

## 2016-03-05 NOTE — Progress Notes (Signed)
  Subjective:  POST-OP CHECK:  Patient seen in her hospital room. Her husband is at the bedside. Her nurses in the room. Patient is not having any pain. Her spinal block seems to be still working.   Objective:   VITALS:   Vitals:   03/05/16 1110 03/05/16 1120 03/05/16 1135 03/05/16 1206  BP:  (!) 100/48 (!) 103/54 (!) 115/97  Pulse: 65 63 (!) 56 68  Resp: 17 12 12 16   Temp:   97.6 F (36.4 C) 97.5 F (36.4 C)  TempSrc:    Oral  SpO2: 97% 96% 97% 99%  Weight:      Height:        PHYSICAL EXAM:  Left lower extremity: Patient's dressing remains clean dry and intact. Her Polar Care unit was leaking and was disconnected.  Patient beginning to flex and extend her toes but still has not had the return of sensation to her left foot. She has palpable pedal pulses.   LABS  No results found for this or any previous visit (from the past 24 hour(s)).  Dg Knee Left Port  Result Date: 03/05/2016 CLINICAL DATA:  Total knee replacement EXAM: PORTABLE LEFT KNEE - 1-2 VIEW COMPARISON:  None. FINDINGS: Total knee replacement in satisfactory position alignment. No fracture or complication. Soft tissue drains in place. IMPRESSION: Satisfactory knee replacement. Electronically Signed   By: Marlan Palauharles  Clark M.D.   On: 03/05/2016 11:29    Assessment/Plan: Day of Surgery   Active Problems:   S/P total knee arthroplasty  Patient doing well postop. I reviewed her postoperative x-ray showing the total knee arthroplasty components are well positioned. There is no fracture or dislocation. Patient will continue on 24 hours of postop antibiotics. She'll begin physical therapy. Patient will be started on Lovenox for DVT prophylaxis beginning tomorrow. Patient is a hinge knee brace allowing for 0-90 of flexion due to possible MCL sprain. Labs will be drawn in the morning.   Juanell FairlyKRASINSKI, Basil Buffin , MD 03/05/2016, 12:14 PM

## 2016-03-06 ENCOUNTER — Encounter
Admission: RE | Admit: 2016-03-06 | Discharge: 2016-03-06 | Disposition: A | Discharge status: Home / Self Care | Payer: Medicare Other | Source: Ambulatory Visit | Attending: Internal Medicine | Admitting: Internal Medicine

## 2016-03-06 ENCOUNTER — Ambulatory Visit: Payer: Self-pay | Admitting: Pain Medicine

## 2016-03-06 ENCOUNTER — Encounter: Payer: Self-pay | Admitting: Orthopedic Surgery

## 2016-03-06 LAB — HEMOGLOBIN: HEMOGLOBIN: 10.8 g/dL — AB (ref 12.0–16.0)

## 2016-03-06 MED ORDER — SODIUM CHLORIDE 0.9 % IV BOLUS (SEPSIS)
500.0000 mL | Freq: Once | INTRAVENOUS | Status: AC
  Administered 2016-03-06: 500 mL via INTRAVENOUS

## 2016-03-06 MED ORDER — HYDROMORPHONE HCL 1 MG/ML IJ SOLN
1.0000 mg | INTRAMUSCULAR | Status: DC | PRN
  Administered 2016-03-06 – 2016-03-07 (×2): 1 mg via INTRAVENOUS
  Filled 2016-03-06 (×3): qty 1

## 2016-03-06 MED ORDER — OXYCODONE HCL 5 MG PO TABS
5.0000 mg | ORAL_TABLET | Freq: Four times a day (QID) | ORAL | Status: DC | PRN
  Administered 2016-03-06 – 2016-03-07 (×2): 5 mg via ORAL
  Filled 2016-03-06 (×2): qty 1

## 2016-03-06 MED ORDER — DOCUSATE SODIUM 100 MG PO CAPS: 100.0000 mg | ORAL_CAPSULE | Freq: Two times a day (BID) | ORAL | Status: DC

## 2016-03-06 MED ORDER — TRAMADOL HCL 50 MG PO TABS
50.0000 mg | ORAL_TABLET | Freq: Four times a day (QID) | ORAL | Status: DC | PRN
  Administered 2016-03-06 – 2016-03-07 (×2): 50 mg via ORAL
  Filled 2016-03-06 (×2): qty 1

## 2016-03-06 MED ORDER — ENOXAPARIN SODIUM 40 MG/0.4ML ~~LOC~~ SOLN: SUBCUTANEOUS | 0 refills | Status: AC

## 2016-03-06 MED ORDER — OXYCODONE HCL 5 MG PO TABS: 5.0000 mg | ORAL_TABLET | ORAL | 0 refills | Status: AC | PRN

## 2016-03-06 NOTE — Progress Notes (Signed)
Patient was recovering from a knee surgery. She informed me that she was in pain but was hopeful the pain will go away soon. She expressed need for prayer and spiritual support.

## 2016-03-06 NOTE — Care Management Important Message (Signed)
Important Message  Patient Details  Name: Maria Faulkner MRN: [id number] Date of Birth: 1940-12-16   Medicare Important Message Given:  Yes    Marily MemosLisa M Cosby Proby, RN 03/06/2016, 9:28 AM

## 2016-03-06 NOTE — Progress Notes (Signed)
Patient BP 85/50 Dr. Martha ClanKrasinski notified, 500cc bolus of normal saline. Post bolus BP 92/42. Hospitalist consult ordered.  Harvie HeckMelanie Orest Dygert, RN

## 2016-03-06 NOTE — Consult Note (Signed)
Medical Consultation  CECIA EGGE ZOX:[id number] DOB: [birth year] DOA: 03/05/2016 PCP: Jaclyn Shaggy, MD   Requesting physician: Dr. Martha Clan Date of consultation: March 06, 2016 Reason for consultation: Hypotension  Impression/Recommendations  75 year old female with a history of hypertension postop day #1 left total knee replacement with postop hypotension.  1. Postop hypotension, asymptomatic: This is most likely due to pain medications. 500 normal saline bolus 2 Continue IV fluids Stop blood pressure medications Monitor blood pressure Check EKG Tylenol or tramadol may be a better option than narcotics especially IV Dilaudid or morphine due to low blood pressure. Check CBC  2. History of hypertension: Stop hypertensive medications at this time.   3. Hyperlipidemia: Continue atorvastatin.  4. Left total knee replacement postop day #1: As per orthopedic surgery   Chief Complaint: Hypertension  HPI:   75 year old female with history of hypertension and hyperlipidemia who is postoperative day #1 for left total knee replacement. Patient has persistent hypotension after surgery. She has been given multiple doses of IV pain medications due to left knee pain and since that time it is noted that her blood pressure has decreased. She was ordered 500 normal saline boluses morning by Dr. Martha Clan. She denies chest pain, shortness of breath, nausea or vomiting. She denies lightheadedness or headaches. She denies visual changes. Currently her pain is controlled.  Review of Systems  Constitutional: Negative for fever, chills weight loss HENT: Negative for ear pain, nosebleeds, congestion, facial swelling, rhinorrhea, neck pain, neck stiffness and ear discharge.   Respiratory: Negative for cough, shortness of breath, wheezing  Cardiovascular: Negative for chest pain, palpitations and leg swelling.  Gastrointestinal: Negative for heartburn, abdominal pain, vomiting, diarrhea or  consitpation Genitourinary: Negative for dysuria, urgency, frequency, hematuria Musculoskeletal: Negative for back pain or joint pain left knee total knee replacement Neurological: Negative for dizziness, seizures, syncope, focal weakness,  numbness and headaches.  Hematological: Does not bruise/bleed easily.  Psychiatric/Behavioral: Negative for hallucinations, confusion, dysphoric mood   Past Medical History:  Diagnosis Date  . Anxiety   . Arthritis   . Hypercholesteremia   . Hypertension    Past Surgical History:  Procedure Laterality Date  . ABDOMINAL HYSTERECTOMY    . BREAST CYST ASPIRATION  06/15/1988   approximate date, unsure of which breast  . TOTAL KNEE ARTHROPLASTY Right 07/10/2015   Procedure: TOTAL KNEE ARTHROPLASTY;  Surgeon: Juanell Fairly, MD;  Location: [facility] ORS;  Service: Orthopedics;  Laterality: Right;  . TOTAL KNEE ARTHROPLASTY Right    Social History:  reports that she quit smoking about 21 years ago. She has never used smokeless tobacco. She reports that she does not drink alcohol or use drugs.  No Known Allergies History reviewed. No pertinent family history.  Prior to Admission medications   Medication Sig Start Date End Date Taking? Authorizing Provider  acetaminophen (TYLENOL) 650 MG CR tablet Take 650 mg by mouth every 8 (eight) hours as needed for pain.   Yes Historical Provider, MD  aspirin 81 MG tablet Take 81 mg by mouth every morning.   Yes Historical Provider, MD  atorvastatin (LIPITOR) 10 MG tablet Take 10 mg by mouth every morning.   Yes Historical Provider, MD  Calcium Carbonate (CALCIUM 600 PO) Take 1 capsule by mouth daily.   Yes Historical Provider, MD  Cholecalciferol (VITAMIN D3) 10000 units TABS Take 1 capsule by mouth daily.   Yes Historical Provider, MD  lisinopril-hydrochlorothiazide (PRINZIDE,ZESTORETIC) 10-12.5 MG tablet Take 1 tablet by mouth daily.   Yes Historical  Provider, MD  Magnesium 500 MG CAPS Take 500 mg by mouth at bedtime.    Yes Historical Provider, MD  meloxicam (MOBIC) 7.5 MG tablet Take 7.5 mg by mouth daily.   Yes Historical Provider, MD  Multiple Vitamin (MULTIVITAMIN) tablet Take 1 tablet by mouth every morning.   Yes Historical Provider, MD  Omega-3 Fatty Acids ([employer] FISH OIL) 1200 MG CAPS Take 1 capsule by mouth daily.   Yes Historical Provider, MD  POTASSIUM CHLORIDE ER PO Take 8 mEq by mouth every morning.   Yes Historical Provider, MD  vitamin B-12 (CYANOCOBALAMIN) 1000 MCG tablet Take 1,000 mcg by mouth daily.   Yes Historical Provider, MD  vitamin E (VITAMIN E) 400 UNIT capsule Take 400 Units by mouth daily.   Yes Historical Provider, MD    Physical Exam: Blood pressure (!) 82/50, pulse 63, temperature 98.6 F (37 C), temperature source Oral, resp. rate 16, height 5\' 2"  (1.575 m), weight 90.7 kg (200 lb), SpO2 97 %. @VITALS2 @ [employer]   03/05/16 0614  Weight: 90.7 kg (200 lb)    Intake/Output Summary (Last 24 hours) at 03/06/16 1006 Last data filed at 03/06/16 0900  Gross per 24 hour  Intake          3541.25 ml  Output             1100 ml  Net          2441.25 ml     Constitutional: Appears well-developed and well-nourished. No distress. HENT: Normocephalic. Marland Kitchen. Oropharynx is clear and moist.  Eyes: Conjunctivae and EOM are normal. PERRLA, no scleral icterus.  Neck: Normal ROM. Neck supple. No JVD. No tracheal deviation. CVS: RRR, S1/S2 +, no murmurs, no gallops, no carotid bruit.  Pulmonary: Effort and breath sounds normal, no stridor, rhonchi, wheezes, rales.  Abdominal: Soft. BS +,  no distension, tenderness, rebound or guarding.  Musculoskeletal: Left knee: She has polar care unit connected She has a knee brace  No edema and no tenderness.  Neuro: Alert. CN 2-12 grossly intact. No focal deficits. Skin: Skin is warm and dry. No rash noted. Psychiatric: Normal mood and affect.    Labs  Basic Metabolic Panel: No results for input(s): NA, K, CL, CO2, GLUCOSE, BUN, CREATININE,  CALCIUM, MG, PHOS in the last 168 hours. Liver Function Tests: No results for input(s): AST, ALT, ALKPHOS, BILITOT, PROT, ALBUMIN in the last 168 hours. No results for input(s): LIPASE, AMYLASE in the last 168 hours.  CBC: No results for input(s): WBC, NEUTROABS, HGB, HCT, MCV, PLT in the last 168 hours. Cardiac Enzymes: No results for input(s): CKTOTAL, CKMB, CKMBINDEX, TROPONINI in the last 168 hours. BNP: Invalid input(s): POCBNP CBG: No results for input(s): GLUCAP in the last 168 hours.  Radiological Exams: Dg Knee Left Port  Result Date: 03/05/2016 CLINICAL DATA:  Total knee replacement EXAM: PORTABLE LEFT KNEE - 1-2 VIEW COMPARISON:  None. FINDINGS: Total knee replacement in satisfactory position alignment. No fracture or complication. Soft tissue drains in place. IMPRESSION: Satisfactory knee replacement. Electronically Signed   By: Marlan Palauharles  Clark M.D.   On: 03/05/2016 11:29    EKG: pending   Thank you for allowing me to participate in the care of your patient. We will continue to follow.   Note: This dictation was prepared with Dragon dictation along with smaller phrase technology. Any transcriptional errors that result from this process are unintentional.  Time spent: 45 minutes D/w nurse and will contact dr Annalee Gentakransiski  Sanskriti Greenlaw, Patricia PesaSITAL,  MD

## 2016-03-06 NOTE — Evaluation (Signed)
Occupational Therapy Evaluation Patient Details Name: Maria Faulkner MRN: [id number] DOB: Jun 07, 1941 Today's Date: 03/06/2016    History of Present Illness Pt. is a 75 y.o. female who was admitted to [facility] s/p L TKA. Pt. wearing hinged knee brace locked into extension, able to unlock 0-90 during PT.   Clinical Impression   Pt. Is a 75 y.o. Female who was admitted for a Left TKR. Pt presents with limited ROM, pain, low BP, weakness, and impaired functional mobility which hinder her ability to complete ADL and IADL tasks. Pt. could benefit from skilled OT services to review A/E use for LE ADLs, to review necessary home modifications, and to improve functional mobility for ADL/IADLs in order to work towards regaining Independence with ADL/IADLs.     Follow Up Recommendations  No OT follow up    Equipment Recommendations       Recommendations for Other Services       Precautions / Restrictions Precautions Precautions: Fall Required Braces or Orthoses:  (Hinged knee brace, locked into extension. Able to unlock 0-90 during PT session) Restrictions Weight Bearing Restrictions: Yes LLE Weight Bearing: Weight bearing as tolerated Other Position/Activity Restrictions: hinged knee brace to L LE, locked to extension outside of therapy; may unlock 0-90 during therapy              ADL Overall ADL's : Needs assistance/impaired Eating/Feeding: Set up   Grooming: Set up                                 General ADL Comments: Pt. was everbally educated about A/E use for LE ADLs. Treatment session limited by Low BP.     Vision     Perception     Praxis      Pertinent Vitals/Pain Pain Assessment: 0-10 Pain Score: 7  Pain Location: Left Knee Pain Descriptors / Indicators: Aching Pain Intervention(s): Limited activity within patient's tolerance;Monitored during session;Repositioned     Hand Dominance Right   Extremity/Trunk Assessment Upper Extremity  Assessment Upper Extremity Assessment: Overall WFL for tasks assessed           Communication Communication Communication: No difficulties   Cognition Arousal/Alertness: Awake/alert Behavior During Therapy: WFL for tasks assessed/performed Overall Cognitive Status: Within Functional Limits for tasks assessed                     General Comments       Exercises     Shoulder Instructions      Home Living Family/patient expects to be discharged to:: Private residence Living Arrangements: Spouse/significant other Available Help at Discharge: Family Type of Home: House Home Access: Stairs to enter Secretary/administrator of Steps: 1   Home Layout: One level     Bathroom Shower/Tub: Tub/shower unit Shower/tub characteristics: Curtain Firefighter: Handicapped height Bathroom Accessibility: Yes   Home Equipment: Environmental consultant - 2 wheels;Cane - quad;Grab bars - tub/shower          Prior Functioning/Environment Level of Independence: Independent with assistive device(s)        Comments: Independent with ADLs.        OT Problem List: Decreased strength;Decreased activity tolerance;Pain   OT Treatment/Interventions: Self-care/ADL training;Therapeutic exercise;Therapeutic activities;DME and/or AE instruction;Patient/family education    OT Goals(Current goals can be found in the care plan section) Acute Rehab OT Goals Patient Stated Goal: To return home OT Goal Formulation: With patient Potential to Achieve Goals:  Good  OT Frequency: Min 1X/week   Barriers to D/C:            Co-evaluation              End of Session    Activity Tolerance: Patient tolerated treatment well Patient left: in chair;with call bell/phone within reach;with chair alarm set   Time: 1335-1400 OT Time Calculation (min): 25 min Charges:  OT General Charges $OT Visit: 1 Procedure OT Evaluation $OT Eval Moderate Complexity: 1 Procedure G-Codes:    Olegario MessierElaine Vedanth Sirico, MS,  OTR/L 03/06/2016, 3:22 PM

## 2016-03-06 NOTE — Progress Notes (Signed)
Plan is for patient to D/C to [facility] over the weekend if medically cleared. Per Kim [facility] to room 210[facility]B. RN will call report at [phone]. Clinical Child psychotherapistocial Worker (CSW) sent D/C orders to [employer] via [employer]. Patient and her husband Aneta Minshillip are aware of above. CSW will continue to follow and assist as needed.   Baker Hughes IncorporatedBailey Chace Klippel, LCSW [phone]

## 2016-03-06 NOTE — Progress Notes (Signed)
  Subjective:  POD #1 s/p left total knee arthroplasty.  Patient reports left knee pain as mild to moderate.  Patient is up out of bed to a chair. Her son is at the bedside. Patient has had episodes of hypotension this morning with systolic blood pressures in the 80s. She has received 2 x 500 cc boluses of normal saline.  Patient feels well. She denies shortness of breath or chest pain.  Objective:   VITALS:   Vitals:   03/06/16 0728 03/06/16 0732 03/06/16 0822 03/06/16 0928  BP: (!) 89/36 (!) 85/50 (!) 92/42 (!) 82/50  Pulse: 72  75 63  Resp: 16     Temp: 98.6 F (37 C)     TempSrc: Oral     SpO2: 97%     Weight:      Height:        PHYSICAL EXAM:  Left knee: Patient's dressing is clean dry and intact. I removed the Hemovac drain. Patient's foot is well perfused and she has palpable pedal pulses. She can flex and extend her toes and dorsiflex and plantarflex her ankle. Her leg compartments are soft and compressible. She has intact sensation light touch throughout the left lower extremity.   LABS  Results for orders placed or performed during the hospital encounter of 03/05/16 (from the past 24 hour(s))  Hemoglobin     Status: Abnormal   Collection Time: 03/06/16 10:49 AM  Result Value Ref Range   Hemoglobin 10.8 (L) 12.0 - 16.0 g/dL    Dg Knee Left Port  Result Date: 03/05/2016 CLINICAL DATA:  Total knee replacement EXAM: PORTABLE LEFT KNEE - 1-2 VIEW COMPARISON:  None. FINDINGS: Total knee replacement in satisfactory position alignment. No fracture or complication. Soft tissue drains in place. IMPRESSION: Satisfactory knee replacement. Electronically Signed   By: Marlan Palauharles  Clark M.D.   On: 03/05/2016 11:29    Assessment/Plan: 1 Day Post-Op   Active Problems:   S/P total knee arthroplasty  Patient with hypotension postop. I spoke with Dr. Juliene PinaMody from the hospitalist service who is evaluated the patient. We are holding her antihypertensives. She is going to have an EKG.  Hypertension is likely due to pain medication. Patient clinically is doing well. Her hemoglobin and hematocrit are within acceptable limits. She will have labs redrawn in the morning. Her Foley catheter has been removed along with her Hemovac drain today. She will continue with physical and occupational therapy. Discharge plans will become more clear over the next 24 hours. Patient will likely be discharged from the hospital on Sunday as long as her blood pressure stabilizes.    Juanell FairlyKRASINSKI, Shadaya Marschner , MD 03/06/2016, 12:05 PM

## 2016-03-06 NOTE — Progress Notes (Signed)
Physical Therapy Treatment Patient Details Name: Maria Faulkner MRN: [id number] DOB: 1940/12/26 Today's Date: 03/06/2016    History of Present Illness Pt. is a 75 y.o. female who was [facility] to [facility] s/p L TKA. Pt. wearing hinged knee brace locked into extension, able to unlock 0-90 during PT.    PT Comments    Pt. In chair upon arrival. Pt. Not motivated to participate in activity due to pain which she rated at 5/10 prior to session. BP assessed prior to activity with pt. Sitting in chair 99/54. Her AROM in the L knee was 6-61 degrees, limited by pain as well as bulk of bandaging and bracing. Required CGA to stand to RW from the recliner chair, once standing BP 84/66 pt. Asymptomatic, after approx 2 mins standing BP re-assessed 101/49 pt. Remained asymptomatic. She was able to ambulate slightly further this afternoon approx. 7210ft with RW min A very slow overall demonstrating step to pattern leading with LLE, very minimal stance time/weight acceptance through LLE and heavy reliance on RW with BUE especially during L stance phase. Pt. Required mod A to return to supine primarily for lifting and positioning of LLE. Would benefit from skilled PT to address above deficits and promote optimal return to PLOF Will continue to follow and update recommendations as appropriate.   Follow Up Recommendations  SNF     Equipment Recommendations  Rolling walker with 5" wheels    Recommendations for Other Services       Precautions / Restrictions Precautions Precautions: Fall Required Braces or Orthoses:  (Hinged knee brace, locked into extension. Able to unlock 0-90 during PT session) Restrictions Weight Bearing Restrictions: Yes LLE Weight Bearing: Weight bearing as tolerated Other Position/Activity Restrictions: hinged knee brace to L LE, locked to extension outside of therapy; may unlock 0-90 during therapy    Mobility  Bed Mobility Overal bed mobility: Needs Assistance Bed Mobility: Sit to  Supine     Supine to sit: Min assist Sit to supine: Mod assist (Pt. required assist for LLE to lift up into bed and position fully into supine)   General bed mobility comments: for management of L LE over edge of bed  Transfers Overall transfer level: Needs assistance Equipment used: Rolling walker (2 wheeled) Transfers: Sit to/from Stand Sit to Stand: Min guard         General transfer comment: Pt. able to stand from recliner chair with one UE on RW for assist. significant weight shift to R during movement transition. LLE anterior to BOS  Ambulation/Gait Ambulation/Gait assistance: Min assist Ambulation Distance (Feet): 10 Feet Assistive device: Rolling walker (2 wheeled)       General Gait Details: Pt. demonstrates step to pattern leading with LLE, heavy use of RW for B UE support, especially during stance on LLE, minimal weight acceptance/stance time through LLE. wide BOS with LLE abducted possibly related to bracing and bandaging position   Stairs            Wheelchair Mobility    Modified Rankin (Stroke Patients Only)       Balance Overall balance assessment: Needs assistance Sitting-balance support: No upper extremity supported;Feet supported Sitting balance-Leahy Scale: Good     Standing balance support: Bilateral upper extremity supported Standing balance-Leahy Scale: Fair                      Cognition Arousal/Alertness: Awake/alert Behavior During Therapy: WFL for tasks assessed/performed Overall Cognitive Status: Within Functional Limits for tasks assessed  Exercises Total Joint Exercises Goniometric ROM: 6-61 degrees L knee flexion  Other Exercises Other Exercises: Seated in chair L knee flexion stretching 3x30seconds, supine LE AROM/strengthening exercises; x10 ankle pumps, quad sets, SLR (AAROM), heel slides (AAROM), hip ab/add (AAROM)     General Comments        Pertinent Vitals/Pain Pain  Assessment: 0-10 Pain Score: 5  Pain Location: L knee Pain Descriptors / Indicators: Aching Pain Intervention(s): Limited activity within patient's tolerance;Premedicated before session;Monitored during session;RN gave pain meds during session    Home Living Family/patient expects to be discharged to:: Private residence Living Arrangements: Spouse/significant other Available Help at Discharge: Family Type of Home: House Home Access: Stairs to enter   Home Layout: One level Home Equipment: Environmental consultant - 2 wheels;Cane - quad;Grab bars - tub/shower      Prior Function Level of Independence: Independent with assistive device(s)      Comments: Independent with ADLs.   PT Goals (current goals can now be found in the care plan section) Acute Rehab PT Goals Patient Stated Goal: To return home PT Goal Formulation: With patient Time For Goal Achievement: 03/19/16 Potential to Achieve Goals: Good Progress towards PT goals: Progressing toward goals    Frequency    BID      PT Plan Current plan remains appropriate    Co-evaluation             End of Session Equipment Utilized During Treatment: Gait belt Activity Tolerance: Patient limited by pain Patient left: in bed;with bed alarm set;with SCD's reapplied;with family/visitor present (Pt. heel elevated, cryocuff not re-applied due to leakage, nsg informed )     Time: 1424-1500 PT Time Calculation (min) (ACUTE ONLY): 36 min  Charges:  $Therapeutic Exercise: 8-22 mins $Therapeutic Activity: 8-22 mins                    G Codes:      [place] Dec 2016/03/10, 3:32 PM

## 2016-03-06 NOTE — Clinical Social Work Placement (Signed)
   CLINICAL SOCIAL WORK PLACEMENT  NOTE  Date:  03/06/2016  Patient Details  Name: Marin ShutterVirginia B Baroni MRN: [id number] Date of Birth: 1941/04/13  Clinical Social Work is seeking post-discharge placement for this patient at the Skilled  Nursing Facility level of care (*CSW will initial, date and re-position this form in  chart as items are completed):  Yes   Patient/family provided with [facility] Clinical Social Work Department's list of facilities offering this level of care within the geographic area requested by the patient (or if unable, by the patient's family).  Yes   Patient/family informed of their freedom to choose among providers that offer the needed level of care, that participate in Medicare, Medicaid or managed care program needed by the patient, have an available bed and are willing to accept the patient.  Yes   Patient/family informed of [facility]'s ownership interest in [facility] and [facility], as well as of the fact that they are under no obligation to receive care at these facilities.  PASRR submitted to EDS on 03/05/16     PASRR number received on 03/05/16     Existing PASRR number confirmed on       FL2 transmitted to all facilities in geographic area requested by pt/family on 03/05/16     FL2 transmitted to all facilities within larger geographic area on       Patient informed that his/her managed care company has contracts with or will negotiate with certain facilities, including the following:        Yes   Patient/family informed of bed offers received.  Patient [facility] bed at  [facility]     Physician recommends and patient chooses bed at      Patient to be transferred to   on  .  Patient to be transferred to facility by       Patient family notified on   of transfer.  Name of family member notified:        PHYSICIAN       Additional Comment:    _______________________________________________ Ezella Kell, Darleen CrockerBailey M,  LCSW 03/06/2016, 9:32 AM

## 2016-03-06 NOTE — Progress Notes (Signed)
Physical Therapy Treatment Patient Details Name: Maria ShutterVirginia B Quade MRN: [id number] DOB: June 19, 1940 Today's Date: 03/06/2016    History of Present Illness admitted for acute hospitalization status post L TKR (03/05/16), WBAT; per order patient with possible MCL strain, to wear hinged-knee brace locked to extension but may unlock for 0-90 with therapy    PT Comments    Mobility progression limited by pain in L knee and generalized hypotension (likely related to pain meds per RN).  Unable to tolerate activity beyond bed/chair at this time.   Patient asymptomatic with regards to BP and stabilizes with transition to upright; will continue to monitor in PM and progress gait efforts as appropriate.   Follow Up Recommendations  SNF     Equipment Recommendations       Recommendations for Other Services       Precautions / Restrictions      Mobility  Bed Mobility Overal bed mobility: Needs Assistance Bed Mobility: Supine to Sit     Supine to sit: Min assist     General bed mobility comments: for management of L LE over edge of bed  Transfers Overall transfer level: Needs assistance Equipment used: Rolling walker (2 wheeled) Transfers: Sit to/from Stand Sit to Stand: Min assist         General transfer comment: heavy weight shift to R LE with movement transition; limited active use of L LE  Ambulation/Gait Ambulation/Gait assistance: Min assist Ambulation Distance (Feet): 5 Feet Assistive device: Rolling walker (2 wheeled)       General Gait Details: broad BOS (L LE with excessive abduct throughout) with heavy WBing bilat UEs in L LE stance   Stairs            Wheelchair Mobility    Modified Rankin (Stroke Patients Only)       Balance Overall balance assessment: Needs assistance Sitting-balance support: No upper extremity supported;Feet supported Sitting balance-Leahy Scale: Good     Standing balance support: Bilateral upper extremity  supported Standing balance-Leahy Scale: Fair                      Cognition Arousal/Alertness: Awake/alert Behavior During Therapy: WFL for tasks assessed/performed Overall Cognitive Status: Within Functional Limits for tasks assessed                      Exercises Other Exercises Other Exercises: Supine LE therex, 1x15, active vs act assist ROM for muscular strength/endurance: ankle pumps, quad sets, heel slides, hip abduct/adduct, SLR and LAQs.  Difficulty with L knee flexion due to bulkiness of dressing and hinged brace    General Comments        Pertinent Vitals/Pain Pain Assessment: 0-10 Pain Score: 4  Pain Location: L knee Pain Descriptors / Indicators: Aching;Burning;Grimacing Pain Intervention(s): Limited activity within patient's tolerance;Monitored during session;Repositioned;Patient requesting pain meds-RN notified (Limited pain meds available due to hypotension)    Home Living                      Prior Function            PT Goals (current goals can now be found in the care plan section) Acute Rehab PT Goals Patient Stated Goal: "to go to rehab this time" PT Goal Formulation: With patient Time For Goal Achievement: 03/19/16 Potential to Achieve Goals: Good Progress towards PT goals: Progressing toward goals    Frequency    BID  PT Plan Current plan remains appropriate    Co-evaluation             End of Session Equipment Utilized During Treatment: Gait belt Activity Tolerance: Patient tolerated treatment well Patient left: in chair;with call bell/phone within reach;with chair alarm set     Time: 1610-9604 PT Time Calculation (min) (ACUTE ONLY): 34 min  Charges:  $Therapeutic Exercise: 8-22 mins $Therapeutic Activity: 8-22 mins                    G Codes:      Saket Hellstrom H. Manson Passey, PT, DPT, NCS 03/06/16, 2:55 PM [phone]

## 2016-03-06 NOTE — Anesthesia Postprocedure Evaluation (Signed)
Anesthesia Post Note  Patient: Maria Faulkner  Procedure(s) Performed: Procedure(s) (LRB): TOTAL KNEE ARTHROPLASTY (Left)  Patient location during evaluation: Nursing Unit Anesthesia Type: Spinal Level of consciousness: awake, awake and alert and oriented Pain management: pain level controlled Vital Signs Assessment: post-procedure vital signs reviewed and stable Respiratory status: spontaneous breathing, nonlabored ventilation and respiratory function stable Cardiovascular status: stable Postop Assessment: no headache and no backache Anesthetic complications: no    Last Vitals:  Vitals:   03/06/16 0421 03/06/16 0728  BP: (!) 98/38 (!) 89/36  Pulse: 92 72  Resp: 19 16  Temp: 37.1 C 37 C    Last Pain:  Vitals:   03/06/16 0728  TempSrc: Oral  PainSc:                  Lyn RecordsNoles,  Allissa Albright R

## 2016-03-06 NOTE — Discharge Summary (Signed)
Physician Discharge Summary  Patient ID: Maria Faulkner MRN: [id number] DOB/AGE: 08-12-1940 75 y.o.  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 Anxiety   . Arthritis   . Hypercholesteremia   . Hypertension     Surgeries: Procedure(s): TOTAL KNEE ARTHROPLASTY on 03/05/2016   Consultants (if any):   Discharged Condition: Improved  Hospital Course: Maria Faulkner is an 75 y.o. 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   She was admitted to the orthopedic floor postop. She had a spinal anesthetic.  Patient 24 hours postop antibiotics. Her hemoglobin and hematocrit remained stable throughout her hospitalization. She had an Autovac drain which was removed on postop day #1. Her Foley catheter was removed on postop day 1.  Patient was placed in a hinged knee brace for her left knee due to concern of a possible MCL sprain sustained during surgery. Patient was seen and evaluated by physical and occupational therapy postop. They continue to follow her throughout her hospital stay. Vision had hypotension on postop day #1 was given fluid support. The hospitalist service was consulted to see the patient as well. Her antihypertensives were held while she was hypotensive.   She was given perioperative antibiotics:  Anti-infectives    Start     Dose/Rate Route Frequency Ordered Stop   03/05/16 1200  ceFAZolin (ANCEF) IVPB 2g/100 mL premix     2 g 200 mL/hr over 30 Minutes Intravenous Every 6 hours 03/05/16 1159 03/05/16 1856   03/05/16 0600  ceFAZolin (ANCEF)  IVPB 2g/100 mL premix     2 g 200 mL/hr over 30 Minutes Intravenous On call to O.R. 03/04/16 2232 03/05/16 0810   03/05/16 0541  clindamycin (CLEOCIN) 600 MG/50ML IVPB    Comments:  Nicholes Rough: cabinet override      03/05/16 0541 03/05/16 0738   03/05/16 0540  ceFAZolin (ANCEF) 2-4 GM/100ML-% IVPB    Comments:  Nicholes Rough: cabinet override      03/05/16 0540 03/05/16 0740   03/04/16 2245  clindamycin (CLEOCIN) IVPB 600 mg     600 mg 100 mL/hr over 30 Minutes Intravenous  Once 03/04/16 2242 03/05/16 0741    .  She was given sequential compression devices, early ambulation, and Lovenox for DVT prophylaxis.  She benefited maximally from the hospital stay and there were no complications.  Given the patient's clinical improvement she is prepared for discharge.  Recent vital signs:  Vitals:   03/06/16 0822 03/06/16 0928  BP: (!) 92/42 (!) 82/50  Pulse: 75 63  Resp:    Temp:      Recent laboratory studies:  Lab Results  Component Value Date   HGB 10.8 (L) 03/06/2016   HGB 13.9 02/24/2016   HGB 12.0 07/11/2015   Lab Results  Component Value Date   WBC 6.7 02/24/2016   PLT 239 02/24/2016   Lab Results  Component Value Date   INR 1.01 02/24/2016   Lab Results  Component Value Date   NA 141 02/24/2016   K 4.6 02/24/2016   CL 107 02/24/2016   CO2 28 02/24/2016   BUN 20 02/24/2016   CREATININE  0.61 02/24/2016   GLUCOSE 102 (H) 02/24/2016    Discharge Medications:     Medication List    STOP taking these medications   meloxicam 7.5 MG tablet Commonly known as:  MOBIC     TAKE these medications   acetaminophen 650 MG CR tablet Commonly known as:  TYLENOL Take 650 mg by mouth every 8 (eight) hours as needed for pain.   aspirin 81 MG tablet Take 81 mg by mouth every morning.   atorvastatin 10 MG tablet Commonly known as:  LIPITOR Take 10 mg by mouth every morning.   CALCIUM 600 PO Take 1 capsule by mouth daily.   [employer] FISH OIL 1200 MG Caps Take 1  capsule by mouth daily.   enoxaparin 40 MG/0.4ML injection Commonly known as:  LOVENOX One injection subcutaneous once a day   lisinopril-hydrochlorothiazide 10-12.5 MG tablet Commonly known as:  PRINZIDE,ZESTORETIC Take 1 tablet by mouth daily.   Magnesium 500 MG Caps Take 500 mg by mouth at bedtime.   multivitamin tablet Take 1 tablet by mouth every morning.   oxyCODONE 5 MG immediate release tablet Commonly known as:  Oxy IR/ROXICODONE Take 1-2 tablets (5-10 mg total) by mouth every 4 (four) hours as needed for breakthrough pain.   POTASSIUM CHLORIDE ER PO Take 8 mEq by mouth every morning.   vitamin B-12 1000 MCG tablet Commonly known as:  CYANOCOBALAMIN Take 1,000 mcg by mouth daily.   Vitamin D3 10000 units Tabs Take 1 capsule by mouth daily.   vitamin E 400 UNIT capsule Generic drug:  vitamin E Take 400 Units by mouth daily.       Diagnostic Studies: Dg Knee Left Port  Result Date: 03/05/2016 CLINICAL DATA:  Total knee replacement EXAM: PORTABLE LEFT KNEE - 1-2 VIEW COMPARISON:  None. FINDINGS: Total knee replacement in satisfactory position alignment. No fracture or complication. Soft tissue drains in place. IMPRESSION: Satisfactory knee replacement. Electronically Signed   By: Marlan Palauharles  Clark M.D.   On: 03/05/2016 11:29    Disposition: 06-Home-Health Care Svc  Discharge Instructions    Call MD / Call 911    Complete by:  As directed    If you experience chest pain or shortness of breath, CALL 911 and be transported to the hospital emergency room.  If you develope a fever above 101 F, pus (white drainage) or increased drainage or redness at the wound, or calf pain, call your surgeon's office.   Constipation Prevention    Complete by:  As directed    Drink plenty of fluids.  Prune juice may be helpful.  You may use a stool softener, such as Colace (over the counter) 100 mg twice a day.  Use MiraLax (over the counter) for constipation as needed.   Diet - low  sodium heart healthy    Complete by:  As directed    Discharge instructions    Complete by:  As directed    Continue WBAT on the left lower extremity in the hinged knee brace x 6 weeks. Continue to use TED stockings until follow-up. Patient may remove them at night for sleep. Elevate the left lower extremity whenever possible. Continue to use knee brace locked in extension at night or when lying in bed or when elevating the operative leg. The patient may remove unlock the knee brace to perform exercises or sit in a chair. Continue using the Polar Care for comfort. Keep incision clean and dry. Cover the left knee incision during showers with a  plastic bag or Saran wrap. Take lovenox injection once a day for blood clot prevention. Continue to work on knee range of motion exercises at home as instructed by physical therapy. Continue to use a walker for assistance with ambulation until follow-up. Follow up with Dr. Martha Clan in the office in 10-14 days.   Driving restrictions    Complete by:  As directed    No driving for 6-8 weeks   Increase activity slowly as tolerated    Complete by:  As directed    Lifting restrictions    Complete by:  As directed    No lifting for 12-16 weeks      Contact information for after-discharge care    Destination    [facility] .   Specialty:  Skilled Nursing Facility Contact information: [address] [place] [address] [phone]               Signed: Juanell Fairly ,MD 03/06/2016, 12:10 PM

## 2016-03-06 NOTE — Progress Notes (Signed)
Foley removed at 0515 with 150cc urine output

## 2016-03-06 NOTE — Clinical Social Work Note (Signed)
Clinical Social Work Assessment  Patient Details  Name: Maria Faulkner MRN: [id number] Date of Birth: 05/13/1941  Date of referral:  03/06/16               Reason for consult:  Facility Placement                Permission sought to share information with:  Chartered certified accountant granted to share information::  Yes, Verbal Permission Granted  Name::      [place]::   [place]   Relationship::     Contact Information:     Housing/Transportation Living arrangements for the past 2 months:  [place] of Information:  Patient, Spouse Patient Interpreter Needed:  None Criminal Activity/Legal Involvement Pertinent to Current Situation/Hospitalization:  No - Comment as needed Significant Relationships:  Adult Children, Spouse Lives with:  Spouse Do you feel safe going back to the place where you live?  Yes Need for family participation in patient care:  Yes (Comment)  Care giving concerns:  Patient lives in [place] with her husband Maria Faulkner.    Social Worker assessment / plan:  Holiday representative (CSW) received SNF consult. PT is recommending SNF on post op day 0. CSW met with patient and her husband [name] was at bedside. CSW introduced self and explained role of CSW department. Patient was alert and oriented and was sitting up in the bed. Patient reported that she lives in [place] with her husband and has 2 adult children. CSW explained that PT is recommending SNF and that may change to home health. Patient reported that she is agreeable to SNF search and prefers [facility] Per patient if she goes home she wants to use [facility].   FL2 complete and faxed out. CSW presented bed offers. Patient chose [facility]. CSW will continue to follow and assist as needed.     Employment status:  Retired Nurse, adult PT Recommendations:  [place]  / Referral to community resources:  [place]  Patient/Family's Response to care:  Patient is agreeable to going to [facility] or home health if PT changes recommendation.   Patient/Family's Understanding of and Emotional Response to Diagnosis, Current Treatment, and Prognosis:  Patient and husband were very pleasant and thanked CSW for assistance.   Emotional Assessment Appearance:  Appears stated age Attitude/Demeanor/Rapport:    Affect (typically observed):  Accepting, Adaptable, Pleasant Orientation:  Oriented to Self, Oriented to Place, Oriented to  Time, Oriented to Situation Alcohol / Substance use:  Not Applicable Psych involvement (Current and /or in the community):  No (Comment)  Discharge Needs  Concerns to be addressed:  Discharge Planning Concerns Readmission within the last 30 days:  No Current discharge risk:  Dependent with Mobility Barriers to Discharge:  Continued Medical Work up   [employer], Veronia Beets, LCSW 03/06/2016, 9:33 AM

## 2016-03-07 LAB — BASIC METABOLIC PANEL
Anion gap: 3 — ABNORMAL LOW (ref 5–15)
BUN: 24 mg/dL — ABNORMAL HIGH (ref 6–20)
CO2: 28 mmol/L (ref 22–32)
Calcium: 8.7 mg/dL — ABNORMAL LOW (ref 8.9–10.3)
Chloride: 102 mmol/L (ref 101–111)
Creatinine, Ser: 0.85 mg/dL (ref 0.44–1.00)
GFR calc Af Amer: >60 mL/min (ref >60)
GFR calc non Af Amer: >60 mL/min (ref >60)
Glucose, Bld: 128 mg/dL — ABNORMAL HIGH (ref 65–99)
Potassium: 3.5 mmol/L (ref 3.5–5.1)
Sodium: 133 mmol/L — ABNORMAL LOW (ref 135–145)

## 2016-03-07 NOTE — Progress Notes (Signed)
Subjective: No complaints  Objective: Vital signs in last 24 hours: Temp:  [97.7 F (36.5 C)-98.9 F (37.2 C)] 97.8 F (36.6 C) (09/23 0725) Pulse Rate:  [63-72] 66 (09/23 0725) Resp:  [18] 18 (09/23 0725) BP: (96-120)/(40-72) 100/54 (09/23 0725) SpO2:  [95 %-100 %] 100 % (09/23 0725) Weight change:  Last BM Date:  (03/04/2016)  Intake/Output from previous day: 09/22 0701 - 09/23 0700 In: 1345 [P.O.:600; I.V.:745] Out: 475 [Urine:475] Intake/Output this shift: Total I/O In: 120 [P.O.:120] Out: -   General appearance: alert, cooperative and no distress Cardio: regular rate and rhythm, S1, S2 normal, no murmur, click, rub or gallop  Lab Results:  Recent Labs  03/06/16 1049  HGB 10.8*   BMET  Recent Labs  03/07/16 0344  NA 133*  K 3.5  CL 102  CO2 28  GLUCOSE 128*  BUN 24*  CREATININE 0.85  CALCIUM 8.7*    Studies/Results: Dg Knee Left Port  Result Date: 03/05/2016 CLINICAL DATA:  Total knee replacement EXAM: PORTABLE LEFT KNEE - 1-2 VIEW COMPARISON:  None. FINDINGS: Total knee replacement in satisfactory position alignment. No fracture or complication. Soft tissue drains in place. IMPRESSION: Satisfactory knee replacement. Electronically Signed   By: Marlan Palauharles  Clark M.D.   On: 03/05/2016 11:29    Medications: I have reviewed the patient's current medications.  Assessment/Plan: 1. Hypotension: Blood pressure has been stable since holding lisinipril/HCTZ and giving fluid bolus yesterday. Asymptomatic while working with PT today. Recommended to patient she not restart her BP meds for 2 more days and is able to back off of pain meds some. Told her to watch for symptoms of lightheadedness or dizziness. If this occurs then stop BP meds and call her PCP.     LOS: 2 days   Gracelyn NurseJohnston,  John D 03/07/2016, 11:08 AM

## 2016-03-07 NOTE — Progress Notes (Signed)
Physical Therapy Treatment Patient Details Name: Maria Faulkner MRN: [id number] DOB: January 14, 1941 Today's Date: 03/07/2016    History of Present Illness Pt. is a 75 y.o. female who was admitted to [facility] s/p L TKA. Pt. wearing hinged knee brace locked into extension, able to unlock 0-90 during PT.    PT Comments    Pt progressing towards goals this date as demonstrated by increased gait distance and decreased assist with bed mobility and transfers.  Pt without c/o dizziness today and reports good pain control.  Pt up to bathroom with nursing and again with PT demonstrating independence with self care.  Pt with UE fatigue during gait due to increased use of UE's.  Will continue with POC and progress towards goals.  Follow Up Recommendations  SNF     Equipment Recommendations  Rolling walker with 5" wheels    Recommendations for Other Services       Precautions / Restrictions Precautions Precautions: Fall Required Braces or Orthoses: Knee Immobilizer - Left Knee Immobilizer - Left: Other (comment) (Hinged, locked in extension; unlock 0-90 for PT) Restrictions LLE Weight Bearing: Weight bearing as tolerated    Mobility  Bed Mobility Overal bed mobility: Needs Assistance       Supine to sit: Min guard Sit to supine: Min guard   General bed mobility comments: Pt able to lift L LE over edge of bed by scooting L thigh back further onto bed  Transfers Overall transfer level: Needs assistance Equipment used: Rolling walker (2 wheeled) Transfers: Sit to/from Stand Sit to Stand: Supervision         General transfer comment: Good safety awareness with transfers, using 1 UE on recliner/bed when transfering to standing; Also, transfers on/off elevated toilet seat with supervision.  Ambulation/Gait Ambulation/Gait assistance: Min guard;Supervision Ambulation Distance (Feet): 60 Feet Assistive device: Rolling walker (2 wheeled) Gait Pattern/deviations: Step-to  pattern;Antalgic     General Gait Details: L LE abducted due to bulky brace; accepting weight on L LE, but still using UE's significantly and c/o UE fatigue with ambulation, requiring standing rest breaks x 3 for about 1 min.   Stairs            Wheelchair Mobility    Modified Rankin (Stroke Patients Only)       Balance   Sitting-balance support: Feet supported Sitting balance-Leahy Scale: Good     Standing balance support: Bilateral upper extremity supported;During functional activity Standing balance-Leahy Scale: Good                      Cognition Arousal/Alertness: Awake/alert Behavior During Therapy: WFL for tasks assessed/performed                        Exercises Total Joint Exercises Goniometric ROM: grossly 0-60 in unlocked hinge brace, limited by bulkiness behind knee Other Exercises Other Exercises: Seated L knee flexion stretching 5x 10 second hold; seated HS; supine AP, QS, GS, ABB/ADD, and SLR (AAROM) to B LE's    General Comments        Pertinent Vitals/Pain Pain Location: L knee Pain Descriptors / Indicators: Aching Pain Intervention(s): Monitored during session;Limited activity within patient's tolerance    Home Living                      Prior Function            PT Goals (current goals can now be found in the care  plan section) Acute Rehab PT Goals Patient Stated Goal: To return home PT Goal Formulation: With patient Time For Goal Achievement: 03/19/16 Potential to Achieve Goals: Good Progress towards PT goals: Progressing toward goals    Frequency    BID      PT Plan Current plan remains appropriate    Co-evaluation             End of Session Equipment Utilized During Treatment: Gait belt Activity Tolerance: Patient tolerated treatment well Patient left: in bed;with call bell/phone within reach     Time: [id number] PT Time Calculation (min) (ACUTE ONLY): 33 min  Charges:  $Gait  Training: 8-22 mins $Therapeutic Exercise: 8-22 mins                    G Codes:      Wanda Cellucci A Antonio Woodhams, PT 03/07/2016, 12:08 PM

## 2016-03-07 NOTE — Progress Notes (Signed)
Physical Therapy Treatment Patient Details Name: Marin ShutterVirginia B Mazor MRN: [id number] DOB: 12-19-1940 Today's Date: 03/07/2016    History of Present Illness Pt. is a 75 y.o. female who [facility]/p L T[facility]. Pt. wearing hinged knee brace locked into extension, able to unlock 0-90 during PT.    PT Comments    Pt with increased gait distance to 150' with RW and supervision.  Pt also able to go up/down 2 steps with supervision.  Pt will have assist on steps at home.  No signs/symptoms of dizziness and decreased use of pain meds this date.  Pt progressing well towards goals.  D/c recs updated to reflect progress.   Follow Up Recommendations  Home health PT     Equipment Recommendations  Rolling walker with 5" wheels    Recommendations for Other Services       Precautions / Restrictions Precautions Precautions: Fall Required Braces or Orthoses: Knee Immobilizer - Left Knee Immobilizer - Left: Other (comment) (Hinged, locked in extension; unlock 0-90 for PT) Restrictions LLE Weight Bearing: Weight bearing as tolerated    Mobility  Bed Mobility Overal bed mobility: Needs Assistance       Supine to sit: Supervision Sit to supine: Supervision   General bed mobility comments: Pt able to perform all bed mobiltiy with supervison.  Transfers Overall transfer level: Needs assistance Equipment used: Rolling walker (2 wheeled) Transfers: Sit to/from Stand Sit to Stand: Supervision         General transfer comment: L LE extended during sitting and standing  Ambulation/Gait Ambulation/Gait assistance: Supervision Ambulation Distance (Feet): 150 Feet Assistive device: Rolling walker (2 wheeled) Gait Pattern/deviations: Step-through pattern     General Gait Details: Pt progressing to step-through gait pattern with standng rest break once.   Stairs Stairs: Yes Stairs assistance: Supervision Stair Management: One rail Right Number of Stairs: 2 General stair  comments: Step up/down with R rail and L walker, verbal cues for sequencing of LE's with decent.  Wheelchair Mobility    Modified Rankin (Stroke Patients Only)       Balance   Sitting-balance support: Feet supported Sitting balance-Leahy Scale: Good     Standing balance support: During functional activity;Bilateral upper extremity supported Standing balance-Leahy Scale: Good                      Cognition Arousal/Alertness: Awake/alert Behavior During Therapy: WFL for tasks assessed/performed Overall Cognitive Status: Within Functional Limits for tasks assessed                      Exercises Other Exercises Other Exercises: Supine AP, QS, SLR, HS, and ABD/ADD bilaterally x 15 reps each.    General Comments        Pertinent Vitals/Pain Pain Location: L knee Pain Descriptors / Indicators: Aching Pain Intervention(s): Monitored during session    Home Living                      Prior Function            PT Goals (current goals can now be found in the care plan section) Acute Rehab PT Goals Patient Stated Goal: To return home PT Goal Formulation: With patient Time For Goal Achievement: 03/19/16 Potential to Achieve Goals: Good    Frequency    BID      PT Plan Discharge plan needs to be updated    Co-evaluation  End of Session Equipment Utilized During Treatment: Gait belt Activity Tolerance: Patient tolerated treatment well Patient left: in bed;with call bell/phone within reach     Time: 1415-1501 PT Time Calculation (min) (ACUTE ONLY): 46 min  Charges:  $Gait Training: 23-37 mins $Therapeutic Exercise: 8-22 mins                    G Codes:      Ceonna Frazzini A Hendrixx Severin 03/28/2016, 4:09 PM

## 2016-03-07 NOTE — Progress Notes (Signed)
OT Cancellation Note  Patient Details Name: Marin ShutterVirginia B Birt MRN: [id number] DOB: 1940-10-19   Cancelled Treatment:     Attempted to see patient this morning, she just finished with PT session and had gone to the bathroom and now back to bed.  PT reported the patient was independent with toileting.  Patient is familiar with AE from previous surgery and is aware how to use it for LB dressing, she also has her husband at home and feels he will be the one to help her with dressing temporarily and she did not feel she needed to go over the equipment again.  Will continue to check to see if patient has any OT needs during her stay.  Jaimi Belle T Verle Wheeling, OTR/L, CLT   Crissa Sowder 03/07/2016, 11:35 AM

## 2016-03-07 NOTE — Progress Notes (Signed)
Subjective: 2 Days Post-Op Procedure(s) (LRB): TOTAL KNEE ARTHROPLASTY (Left)    Patient reports pain as mild. Feels much better.  hgb 10.8   Plans to go home tomorrow. [facility]   Objective:   VITALS:   Vitals:   03/07/16 0410 03/07/16 0725  BP: (!) 103/43 (!) 100/54  Pulse: 63 66  Resp: 18 18  Temp: 97.7 F (36.5 C) 97.8 F (36.6 C)    Neurologically intact Neurovascular intact Sensation intact distally Intact pulses distally Dorsiflexion/Plantar flexion intact Compartment soft  LABS  Recent Labs  03/06/16 1049  HGB 10.8*     Recent Labs  03/07/16 0344  NA 133*  K 3.5  BUN 24*  CREATININE 0.85  GLUCOSE 128*    No results for input(s): LABPT, INR in the last 72 hours.   Assessment/Plan: 2 Days Post-Op Procedure(s) (LRB): TOTAL KNEE ARTHROPLASTY (Left)   Advance diet Up with therapy D/C IV fluids Discharge home with home health tomorrow

## 2016-03-08 NOTE — Progress Notes (Signed)
Subjective: No complaint  Objective: Vital signs in last 24 hours: Temp:  [98 F (36.7 C)-98.8 F (37.1 C)] 98 F (36.7 C) (09/24 0726) Pulse Rate:  [76-96] 79 (09/24 0726) Resp:  [18-19] 18 (09/24 0726) BP: (101-161)/(41-54) 116/46 (09/24 0726) SpO2:  [99 %-100 %] 100 % (09/24 0726) Weight change:  Last BM Date: 03/07/16  Intake/Output from previous day: 09/23 0701 - 09/24 0700 In: 360 [P.O.:360] Out: -  Intake/Output this shift: No intake/output data recorded.  General appearance: no distress Resp: clear to auscultation bilaterally Cardio: regular rate and rhythm, S1, S2 normal, no murmur, click, rub or gallop  Lab Results:  Recent Labs  03/06/16 1049  HGB 10.8*   BMET  Recent Labs  03/07/16 0344  NA 133*  K 3.5  CL 102  CO2 28  GLUCOSE 128*  BUN 24*  CREATININE 0.85  CALCIUM 8.7*    Studies/Results: No results found.  Medications: I have reviewed the patient's current medications.  Assessment/Plan: 1. Hypotension: Blood pressure has been stable since holding lisinipril/HCTZ and giving fluid bolus yesterday. Asymptomatic while working with PT today. Recommended to patient she not restart her BP meds for 2 more days and is able to back off of pain meds some. Told her to watch for symptoms of lightheadedness or dizziness. If this occurs then stop BP meds and call her PCP. Remains stable today so would continue to hold BP meds.  2. DVT Prophylaxsis: On lovenox. Asked if she could go home on something oral. Will differ to ortho.   LOS: 3 days   Gracelyn NurseJohnston,  John D 03/08/2016, 9:08 AM

## 2016-03-08 NOTE — Care Management Note (Signed)
Case Management Note  Patient Details  Name: Maria Faulkner MRN: [id number] Date of Birth: 1941-04-13  Subjective/Objective:           Referral was faxed  to [facility] per previous CM note that Maria Faulkner chose [facility] to be her HH-PT and RN provider. She has a rolling walker. Lovenox not called to her pharmacy prior to discharge per previous plan was to discharge to Rehab. Discharged home today.          Action/Plan:   Expected Discharge Date:                  Expected Discharge Plan:  Home w Home Health Services  In-House Referral:     Discharge planning Services  CM Consult  Post Acute Care Choice:  Home Health Choice offered to:  Patient  DME Arranged:    DME Agency:     HH Arranged:    HH Agency:  [facility]  Status of Service:  In process, will continue to follow  If discussed at Long Length of Stay Meetings, dates discussed:    Additional Comments:  Yena Tisby A, RN 03/08/2016, 12:57 PM

## 2016-03-08 NOTE — Progress Notes (Signed)
Physical Therapy Treatment Patient Details Name: Marin ShutterVirginia B Dimaria MRN: [id number] DOB: Oct 18, 1940 Today's Date: 03/08/2016    History of Present Illness Pt. is a 75 y.o. female who [facility]. Pt. wearing hinged knee brace locked into extension, able to unlock 0-90 during PT.    PT Comments    Pt was seen for instruction for knee brace, for there ex and gait on RW with locked knee brace.  Pt is upset about waiting for therapy and apologized to she and husband for not being seen before late AM.  Pt and husband agreed to call back if any questions about her brace or therapy instructions are not clear once home.  Follow Up Recommendations  Home health PT     Equipment Recommendations  Rolling walker with 5" wheels    Recommendations for Other Services Rehab consult     Precautions / Restrictions Precautions Precautions: Fall Required Braces or Orthoses: Knee Immobilizer - Left Knee Immobilizer - Left: Other (comment) (hinged brace with 90 deg limit for ROM, locked otherwise) Restrictions Weight Bearing Restrictions: Yes LLE Weight Bearing: Weight bearing as tolerated Other Position/Activity Restrictions: hinged knee brace to L LE, locked to extension outside of therapy; may unlock 0-90 during therapy    Mobility  Bed Mobility Overal bed mobility: Needs Assistance Bed Mobility: Supine to Sit;Sit to Supine;Rolling Rolling: Min assist;Mod assist   Supine to sit: Min assist Sit to supine: Min assist   General bed mobility comments: requiring more help today  Transfers Overall transfer level: Needs assistance Equipment used: Rolling walker (2 wheeled) Transfers: Sit to/from [employer] CorporationStand;Stand Pivot Transfers Sit to Stand: Min guard Stand pivot transfers: Min guard       General transfer comment: brace locked for all transitions  Ambulation/Gait Ambulation/Gait assistance: Min guard Ambulation Distance (Feet): 100 Feet Assistive device: Rolling walker (2  wheeled) Gait Pattern/deviations: Step-through pattern;Step-to pattern;Wide base of support;Decreased weight shift to left;Decreased stance time - left Gait velocity: reduced Gait velocity interpretation: Below normal speed for age/gender General Gait Details: steps through but limiting her time on LLE even with brace support   Stairs Stairs:  (declined)          Wheelchair Mobility    Modified Rankin (Stroke Patients Only)       Balance   Sitting-balance support: Feet supported Sitting balance-Leahy Scale: Good       Standing balance-Leahy Scale: Fair                      Cognition Arousal/Alertness: Awake/alert Behavior During Therapy: WFL for tasks assessed/performed Overall Cognitive Status: Within Functional Limits for tasks assessed                      Exercises Total Joint Exercises Ankle Circles/Pumps: AROM;Both;10 reps Heel Slides: AAROM;Left;15 reps (x 2) Hip ABduction/ADduction: AAROM;Left;15 reps Straight Leg Raises: AAROM;Left;10 reps Goniometric ROM: 0-50    General Comments        Pertinent Vitals/Pain Pain Assessment: 0-10 Pain Score: 6  Pain Location: L knee Pain Descriptors / Indicators: Aching Pain Intervention(s): Limited activity within patient's tolerance;Monitored during session;Premedicated before session;Repositioned;Ice applied    Home Living                      Prior Function            PT Goals (current goals can now be found in the care plan section) Acute Rehab PT  Goals Patient Stated Goal: To return home Progress towards PT goals: Progressing toward goals    Frequency    7X/week      PT Plan Current plan remains appropriate    Co-evaluation             End of Session Equipment Utilized During Treatment: Gait belt Activity Tolerance: Patient tolerated treatment well Patient left: in bed;with call bell/phone within reach;with family/visitor present     Time: 1145-1210 PT  Time Calculation (min) (ACUTE ONLY): 25 min  Charges:  $Gait Training: 8-22 mins $Therapeutic Exercise: 8-22 mins                    G Codes:      Ivar Drape 03-15-16, 12:34 PM    Samul Dada, PT MS Acute Rehab Dept. Number: [facility] [id number] and [facility] [phone]

## 2016-03-08 NOTE — Progress Notes (Signed)
DISCHARGE NOTE:  Pt given discharge instructions and prescription. Pt verbalized understanding. Pt wheeled to car by staff.  

## 2016-10-26 IMAGING — MG MM SCREENING BREAST TOMO BILATERAL
8 of 14 series · 8 of 30 positions shown · non-contrast
Comparison: Previous exam(s).

CLINICAL DATA: Screening.

EXAM:
DIGITAL SCREENING BILATERAL MAMMOGRAM WITH 3D TOMO WITH CAD

[R MLO]
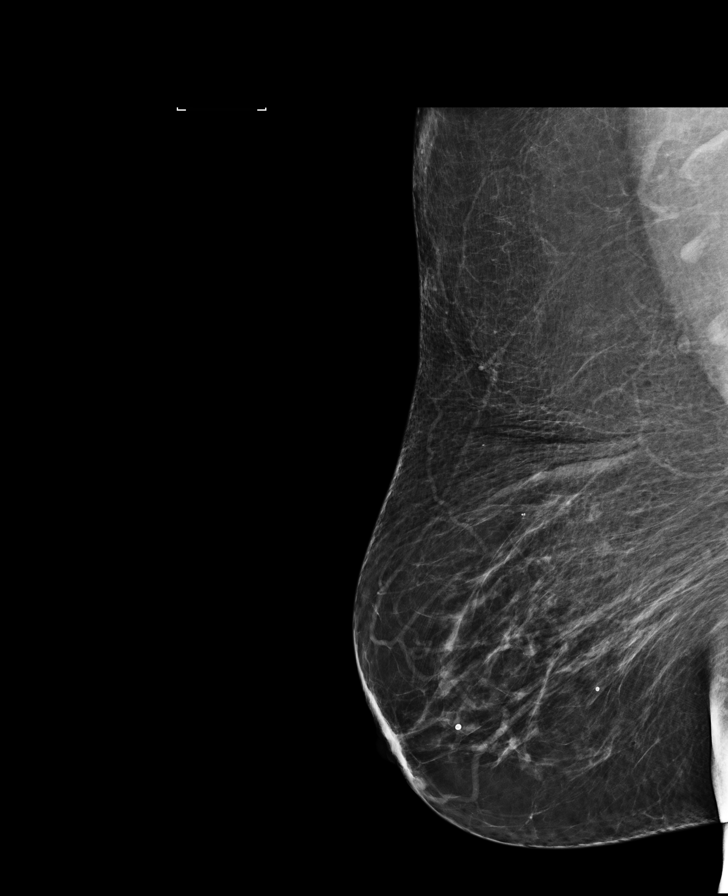

[L MLO (1 of 2)]
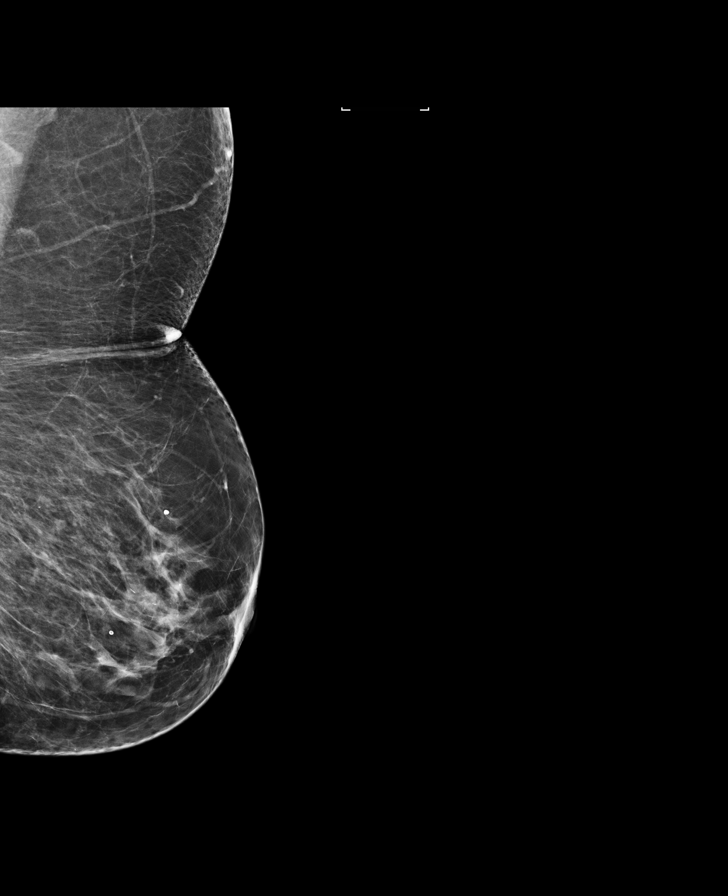

[R CC]
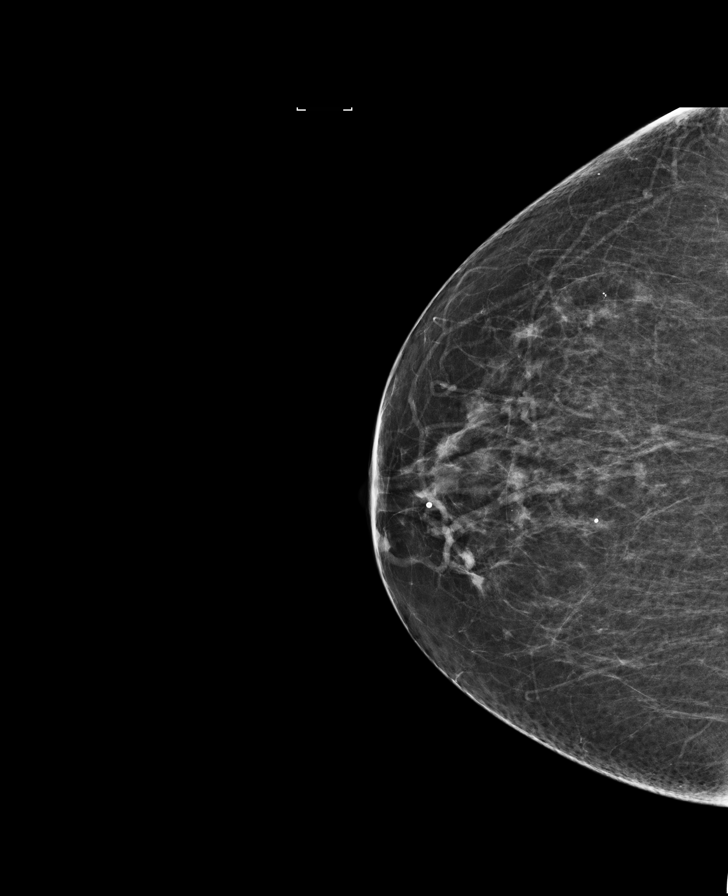

[L MLO synth-2D]
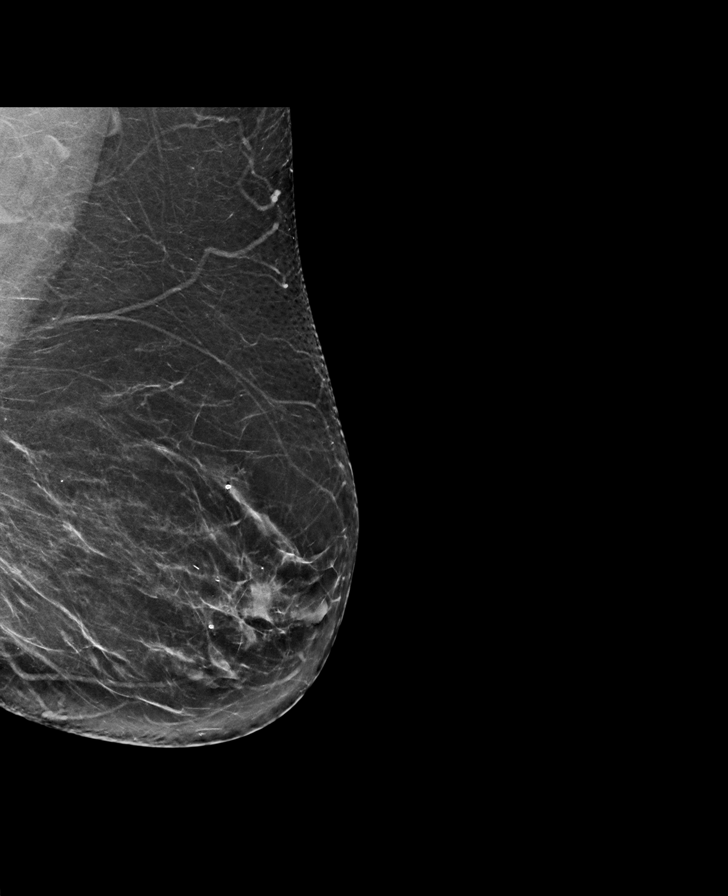

[L CC synth-2D]
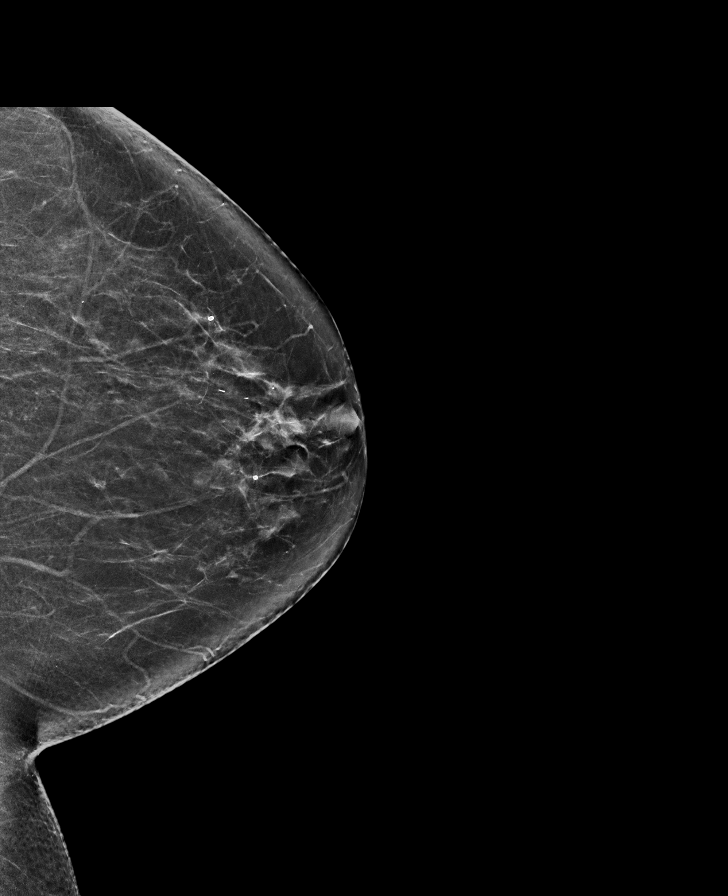

[R CC synth-2D]
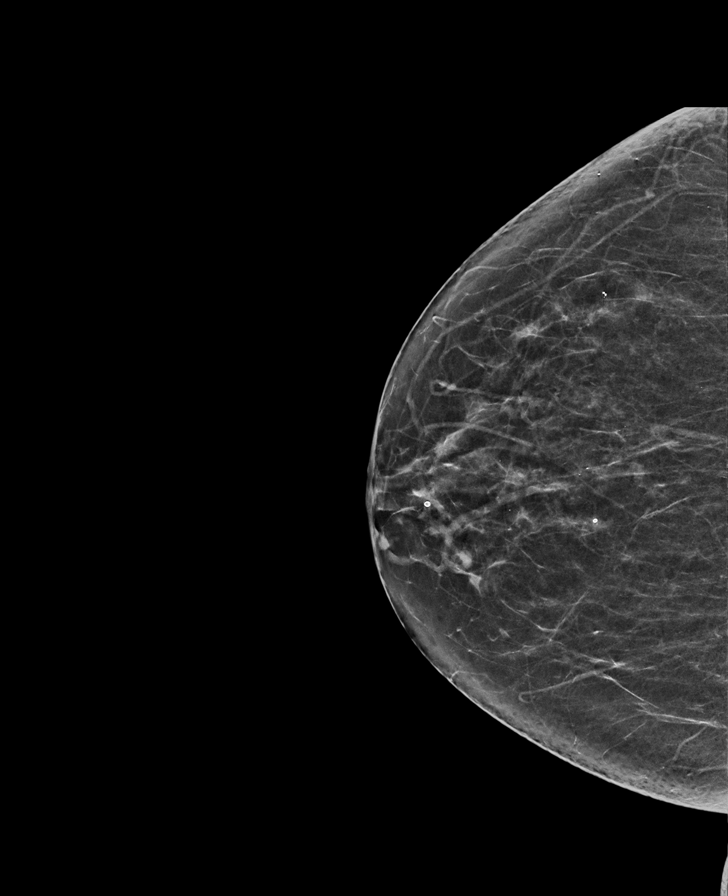

[L MLO (2 of 2)]
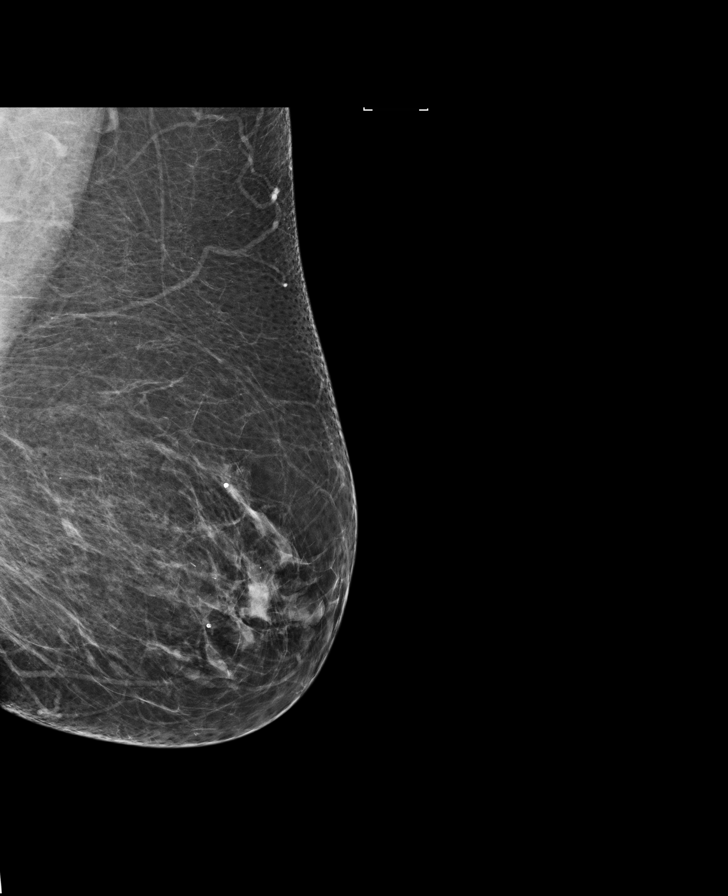

[R MLO synth-2D]
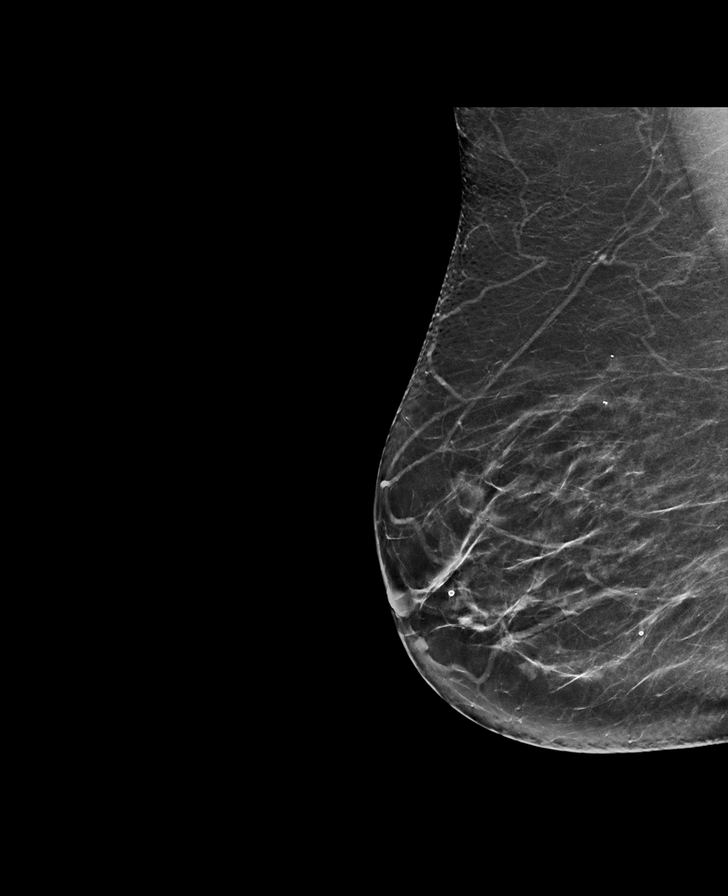

[8 of 30 positions shown; findings below may reference images not displayed]

ACR Breast Density Category b: There are scattered areas of
fibroglandular density.
FINDINGS: There are no findings suspicious for malignancy. Images were
processed with CAD.
IMPRESSION: No mammographic evidence of malignancy. A result letter of this
screening mammogram will be mailed directly to the patient.

RECOMMENDATION:
Screening mammogram in one year. (Code:55-L-23V)

BI-RADS CATEGORY  1: Negative.

## 2017-02-10 IMAGING — CR DG CHEST 2V
1 series · 2 of 2 positions shown · non-contrast
Comparison: None.

CLINICAL DATA: Preop for knee replacement scheduled for July 10, 2015, former smoker

EXAM:
CHEST  2 VIEW

[Series 1: dg chest 2 view · 0.14mm/px · 2 of 2 slices shown]
[im 1/2]
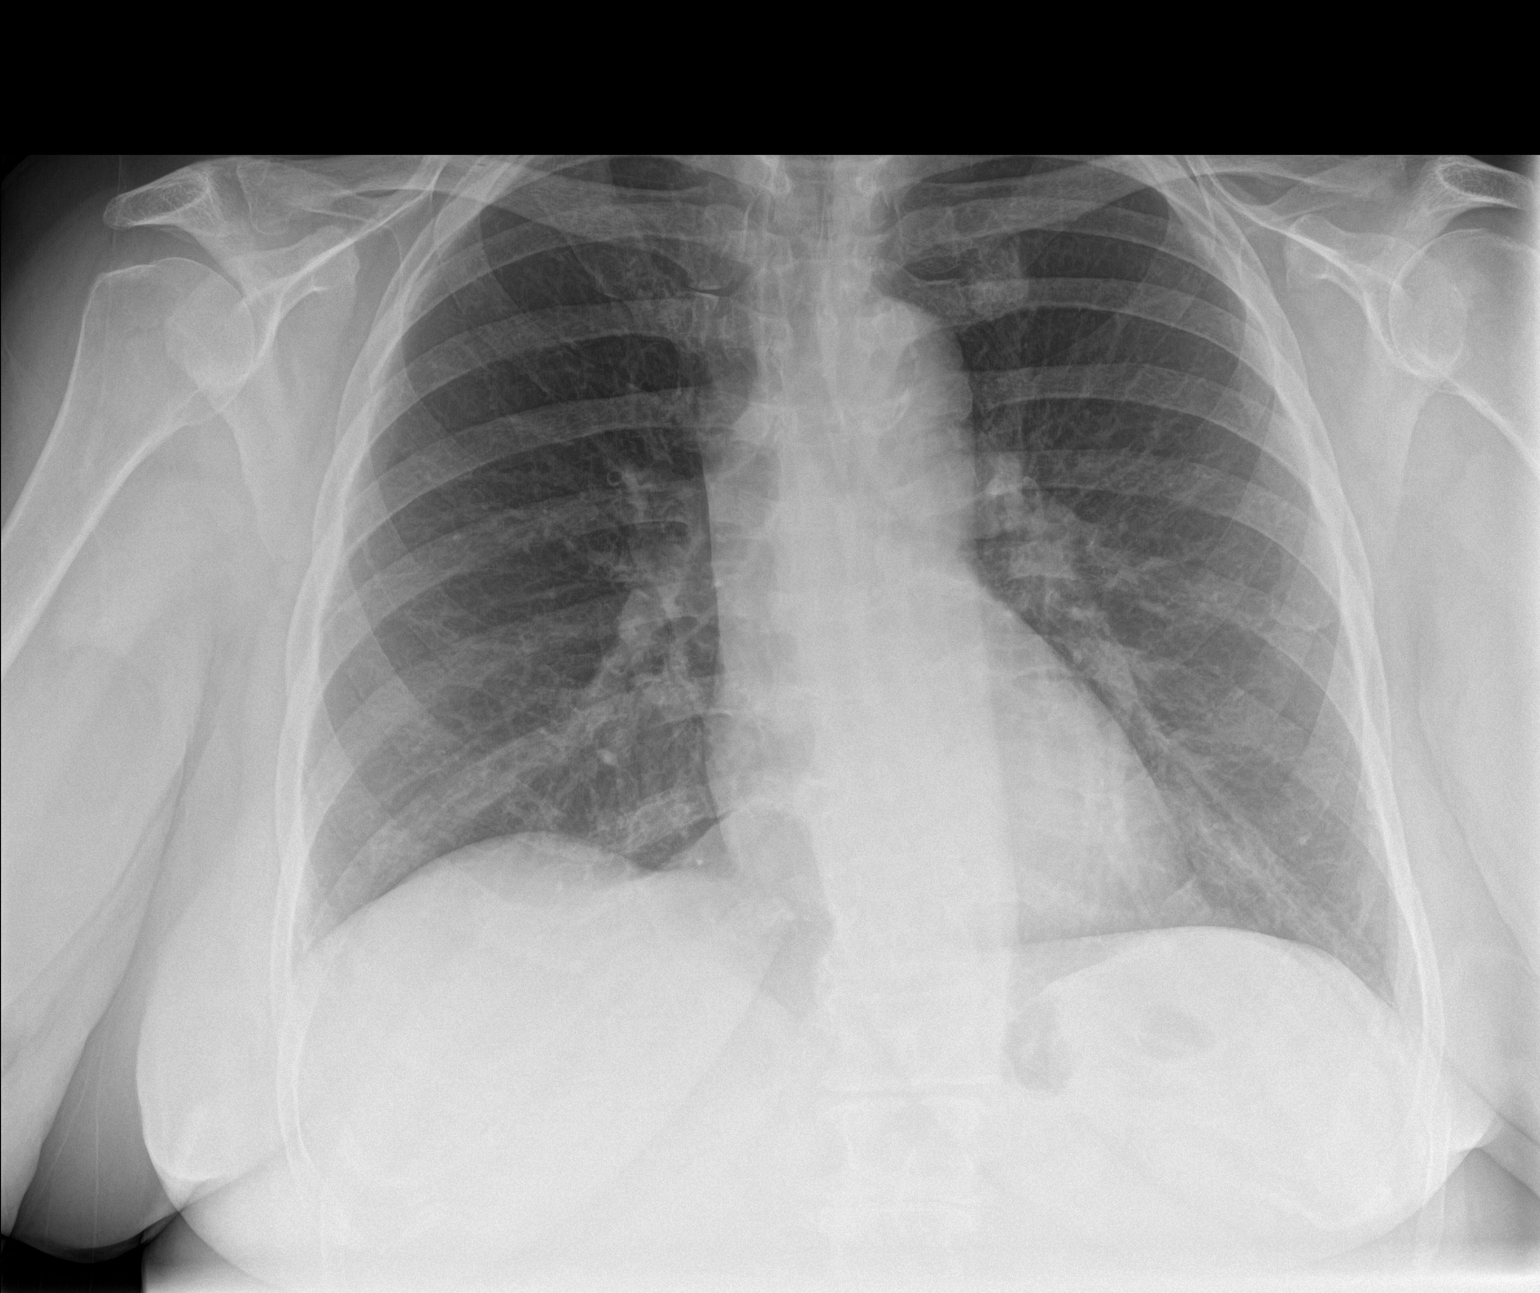
[im 2/2]
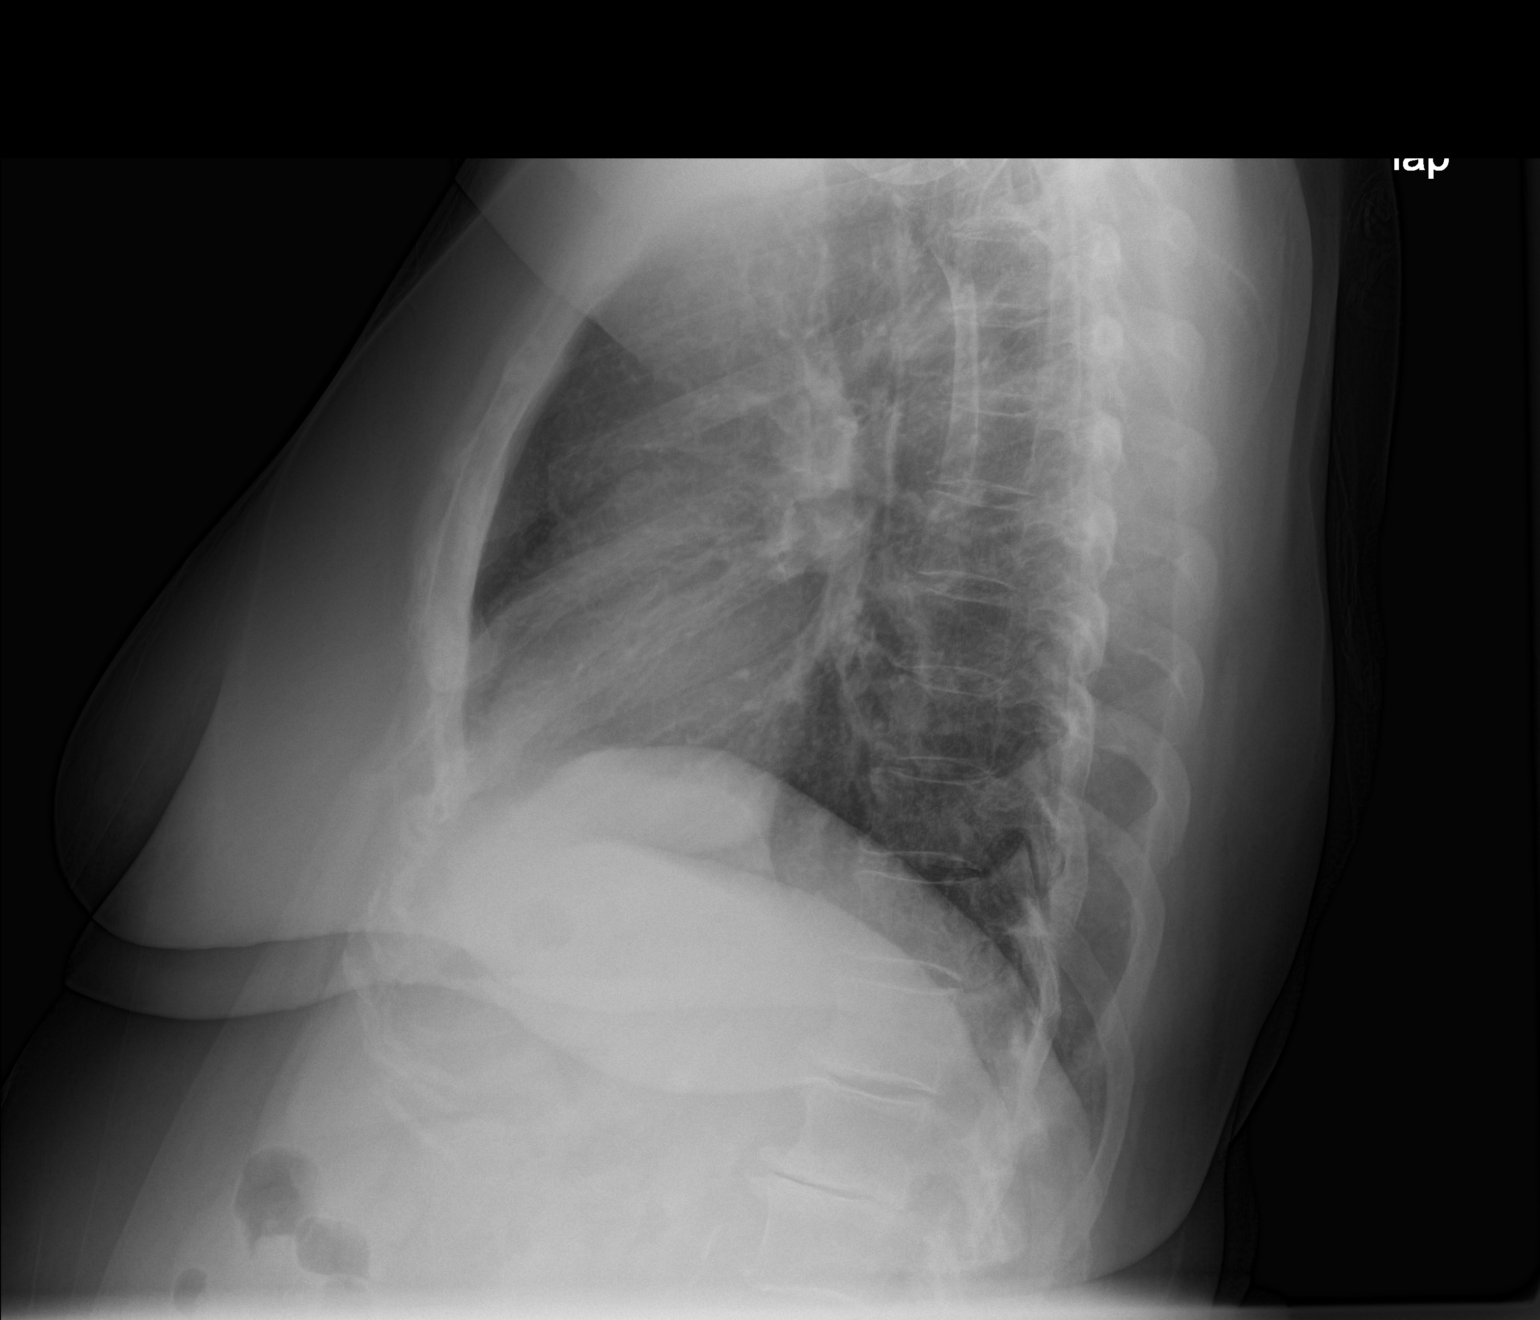

[2 of 2 positions shown; findings below may reference images not displayed]

FINDINGS: The heart size and mediastinal contours are within normal limits.
Both lungs are clear. The visualized skeletal structures are
unremarkable.
IMPRESSION: No active cardiopulmonary disease.

## 2017-02-24 IMAGING — CR DG KNEE 1-2V*R*
1 series · 2 of 2 positions shown · non-contrast
Comparison: None.

CLINICAL DATA: Postop right knee arthroplasty.

EXAM:
RIGHT KNEE - 1-2 VIEW

[Series 1: ap · 0.17mm/px · 2 of 2 slices shown]
[im 1/2]
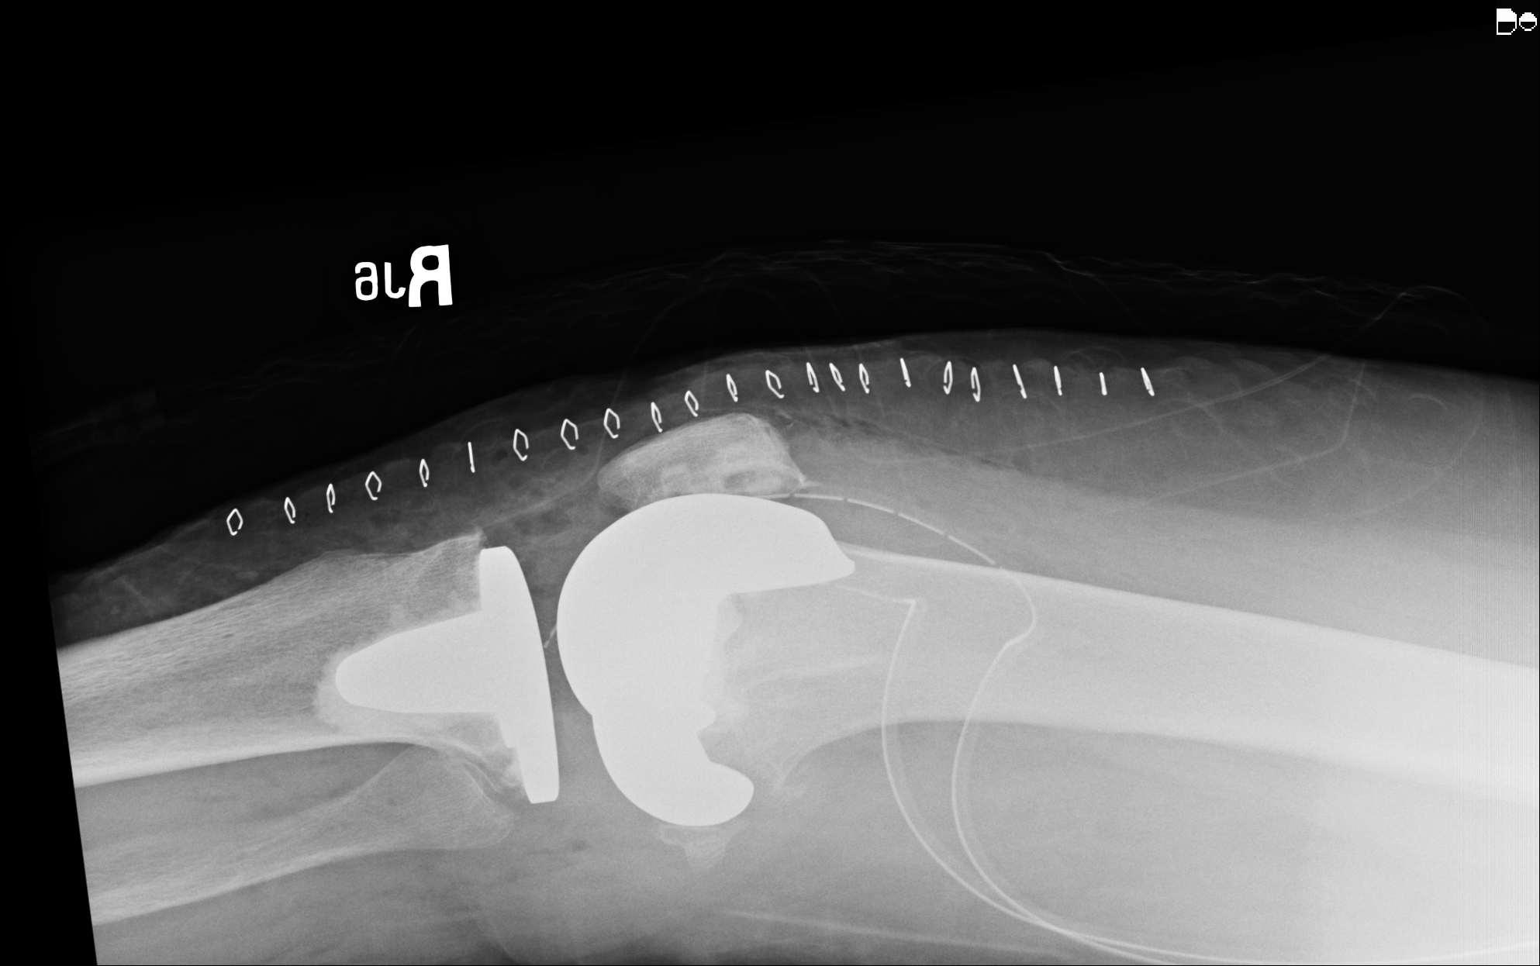
[im 2/2]
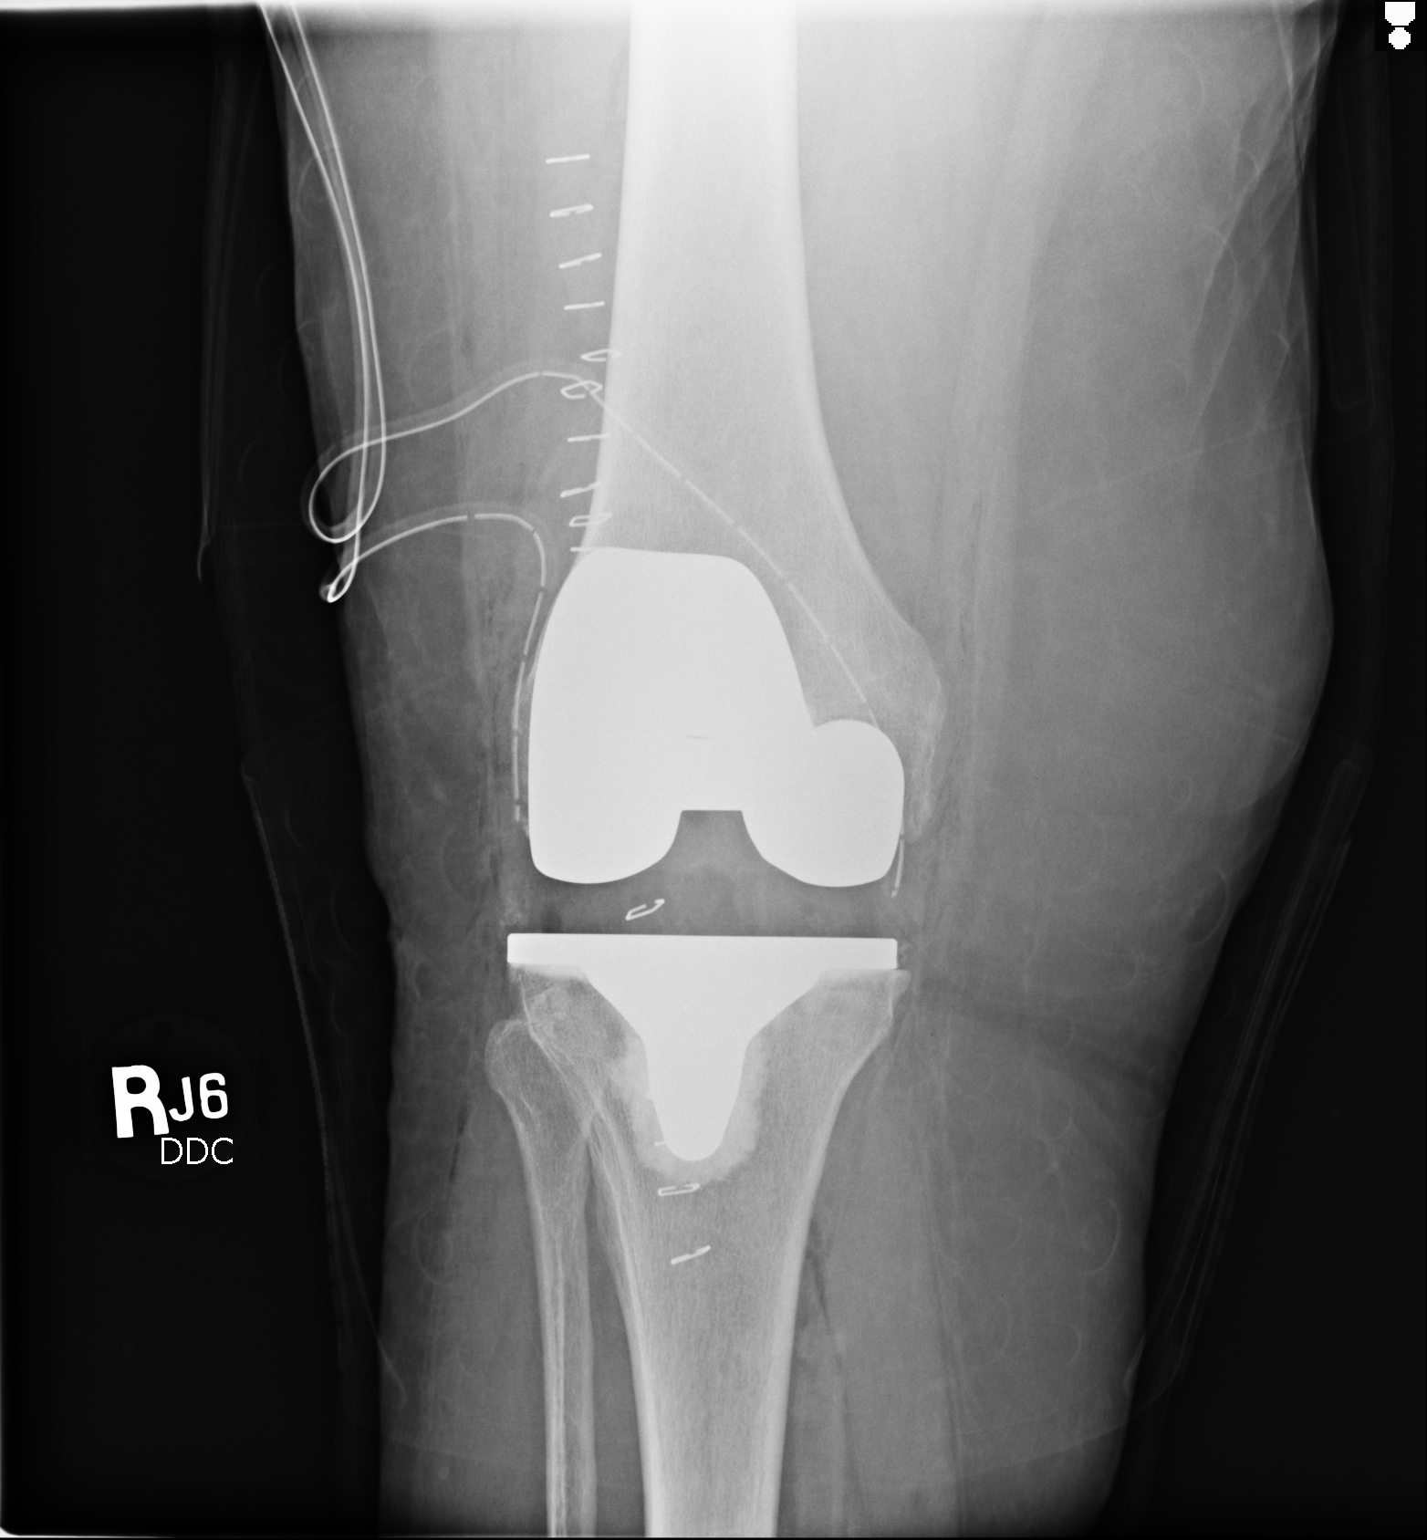

[2 of 2 positions shown; findings below may reference images not displayed]

FINDINGS: Status post right total knee arthroplasty. The hardware is well
positioned. There is no evidence of acute fracture or dislocation.
Surgical drains are in place. There is a small amount gas within the
joint and anterior soft tissues. Anterior skin staples are noted.
IMPRESSION: No demonstrated complication following right total knee
arthroplasty.

## 2017-03-01 ENCOUNTER — Other Ambulatory Visit: Payer: Self-pay | Admitting: Internal Medicine

## 2017-03-01 DIAGNOSIS — Z1231 Encounter for screening mammogram for malignant neoplasm of breast: Secondary | ICD-10-CM

## 2017-03-17 ENCOUNTER — Ambulatory Visit
Admission: RE | Admit: 2017-03-17 | Discharge: 2017-03-17 | Disposition: A | Discharge status: Home / Self Care | Payer: Medicare Other | Source: Ambulatory Visit | Attending: Internal Medicine | Admitting: Internal Medicine

## 2017-03-17 DIAGNOSIS — Z1231 Encounter for screening mammogram for malignant neoplasm of breast: Secondary | ICD-10-CM | POA: Diagnosis present

## 2018-03-29 ENCOUNTER — Other Ambulatory Visit: Payer: Self-pay | Admitting: Internal Medicine

## 2018-03-29 DIAGNOSIS — Z1231 Encounter for screening mammogram for malignant neoplasm of breast: Secondary | ICD-10-CM

## 2018-05-26 ENCOUNTER — Ambulatory Visit
Admission: RE | Admit: 2018-05-26 | Discharge: 2018-05-26 | Disposition: A | Discharge status: Home / Self Care | Payer: Medicare Other | Source: Ambulatory Visit | Attending: Internal Medicine | Admitting: Internal Medicine

## 2018-05-26 DIAGNOSIS — Z1231 Encounter for screening mammogram for malignant neoplasm of breast: Secondary | ICD-10-CM | POA: Insufficient documentation

## 2019-01-13 ENCOUNTER — Encounter: Payer: Self-pay | Admitting: General Surgery

## 2019-09-11 ENCOUNTER — Other Ambulatory Visit: Payer: Self-pay | Admitting: Internal Medicine

## 2019-09-11 DIAGNOSIS — Z1231 Encounter for screening mammogram for malignant neoplasm of breast: Secondary | ICD-10-CM

## 2019-09-20 ENCOUNTER — Ambulatory Visit
Admission: RE | Admit: 2019-09-20 | Discharge: 2019-09-20 | Disposition: A | Discharge status: Home / Self Care | Payer: Medicare Other | Source: Ambulatory Visit | Attending: Internal Medicine | Admitting: Internal Medicine

## 2019-09-20 DIAGNOSIS — Z1231 Encounter for screening mammogram for malignant neoplasm of breast: Secondary | ICD-10-CM | POA: Insufficient documentation

## 2019-09-21 ENCOUNTER — Other Ambulatory Visit: Payer: Self-pay | Admitting: Internal Medicine

## 2019-09-21 DIAGNOSIS — N6489 Other specified disorders of breast: Secondary | ICD-10-CM

## 2019-09-21 DIAGNOSIS — R928 Other abnormal and inconclusive findings on diagnostic imaging of breast: Secondary | ICD-10-CM

## 2019-10-06 ENCOUNTER — Ambulatory Visit
Admission: RE | Admit: 2019-10-06 | Discharge: 2019-10-06 | Disposition: A | Discharge status: Home / Self Care | Payer: Medicare Other | Source: Ambulatory Visit | Attending: Internal Medicine | Admitting: Internal Medicine

## 2019-10-06 DIAGNOSIS — R928 Other abnormal and inconclusive findings on diagnostic imaging of breast: Secondary | ICD-10-CM | POA: Diagnosis present

## 2019-10-06 DIAGNOSIS — N6489 Other specified disorders of breast: Secondary | ICD-10-CM | POA: Diagnosis present

## 2020-12-26 ENCOUNTER — Other Ambulatory Visit: Payer: Self-pay | Admitting: Internal Medicine

## 2020-12-26 DIAGNOSIS — Z1231 Encounter for screening mammogram for malignant neoplasm of breast: Secondary | ICD-10-CM

## 2021-01-08 ENCOUNTER — Other Ambulatory Visit: Payer: Self-pay

## 2021-01-08 ENCOUNTER — Ambulatory Visit
Admission: RE | Admit: 2021-01-08 | Discharge: 2021-01-08 | Disposition: A | Discharge status: Home / Self Care | Payer: Medicare Other | Source: Ambulatory Visit | Attending: Internal Medicine | Admitting: Internal Medicine

## 2021-01-08 DIAGNOSIS — Z1231 Encounter for screening mammogram for malignant neoplasm of breast: Secondary | ICD-10-CM | POA: Insufficient documentation

## 2022-12-30 ENCOUNTER — Other Ambulatory Visit: Payer: Self-pay | Admitting: Internal Medicine

## 2022-12-30 DIAGNOSIS — Z1231 Encounter for screening mammogram for malignant neoplasm of breast: Secondary | ICD-10-CM

## 2023-01-13 ENCOUNTER — Ambulatory Visit
Admission: RE | Admit: 2023-01-13 | Discharge: 2023-01-13 | Disposition: A | Discharge status: Home / Self Care | Payer: Medicare Other | Source: Ambulatory Visit | Attending: Internal Medicine | Admitting: Internal Medicine

## 2023-01-13 DIAGNOSIS — Z1231 Encounter for screening mammogram for malignant neoplasm of breast: Secondary | ICD-10-CM | POA: Diagnosis present
